# Patient Record
Sex: Female | Born: 1960 | Race: White | Hispanic: No | Marital: Married | Smoking: Former smoker
Health system: Southern US, Community
[De-identification: ages and names within clinical notes are randomized; demographics above are authoritative.]

## PROBLEM LIST (undated history)

## (undated) DIAGNOSIS — F909 Attention-deficit hyperactivity disorder, unspecified type: Secondary | ICD-10-CM

## (undated) DIAGNOSIS — M2390 Unspecified internal derangement of unspecified knee: Secondary | ICD-10-CM

## (undated) DIAGNOSIS — M2240 Chondromalacia patellae, unspecified knee: Secondary | ICD-10-CM

## (undated) DIAGNOSIS — N819 Female genital prolapse, unspecified: Secondary | ICD-10-CM

## (undated) DIAGNOSIS — E7800 Pure hypercholesterolemia, unspecified: Secondary | ICD-10-CM

## (undated) DIAGNOSIS — N951 Menopausal and female climacteric states: Secondary | ICD-10-CM

## (undated) DIAGNOSIS — F329 Major depressive disorder, single episode, unspecified: Secondary | ICD-10-CM

## (undated) DIAGNOSIS — R32 Unspecified urinary incontinence: Secondary | ICD-10-CM

## (undated) DIAGNOSIS — M1990 Unspecified osteoarthritis, unspecified site: Secondary | ICD-10-CM

## (undated) DIAGNOSIS — J189 Pneumonia, unspecified organism: Secondary | ICD-10-CM

## (undated) DIAGNOSIS — M549 Dorsalgia, unspecified: Secondary | ICD-10-CM

## (undated) DIAGNOSIS — R102 Pelvic and perineal pain: Secondary | ICD-10-CM

## (undated) DIAGNOSIS — F902 Attention-deficit hyperactivity disorder, combined type: Secondary | ICD-10-CM

## (undated) DIAGNOSIS — G8929 Other chronic pain: Secondary | ICD-10-CM

## (undated) DIAGNOSIS — J309 Allergic rhinitis, unspecified: Secondary | ICD-10-CM

## (undated) DIAGNOSIS — E079 Disorder of thyroid, unspecified: Secondary | ICD-10-CM

## (undated) DIAGNOSIS — E039 Hypothyroidism, unspecified: Secondary | ICD-10-CM

## (undated) HISTORY — DX: Pneumonia, unspecified organism: J18.9

## (undated) HISTORY — DX: Unspecified urinary incontinence: R32

## (undated) HISTORY — DX: Other chronic pain: G89.29

## (undated) HISTORY — PX: TOTAL ABDOMINAL HYSTERECTOMY: SHX209

## (undated) HISTORY — DX: Unspecified osteoarthritis, unspecified site: M19.90

## (undated) HISTORY — DX: Pelvic and perineal pain: R10.2

## (undated) HISTORY — PX: TUBAL LIGATION: SHX77

## (undated) HISTORY — DX: Chondromalacia patellae, unspecified knee: M22.40

## (undated) HISTORY — DX: Dorsalgia, unspecified: M54.9

## (undated) HISTORY — DX: Hypothyroidism, unspecified: E03.9

## (undated) HISTORY — DX: Menopausal and female climacteric states: N95.1

## (undated) HISTORY — DX: Unspecified internal derangement of unspecified knee: M23.90

## (undated) HISTORY — DX: Allergic rhinitis, unspecified: J30.9

## (undated) HISTORY — DX: Female genital prolapse, unspecified: N81.9

## (undated) HISTORY — DX: Major depressive disorder, single episode, unspecified: F32.9

## (undated) HISTORY — PX: ABDOMINAL HYSTERECTOMY: SHX81

## (undated) HISTORY — DX: Pure hypercholesterolemia, unspecified: E78.00

## (undated) HISTORY — DX: Attention-deficit hyperactivity disorder, unspecified type: F90.9

## (undated) HISTORY — DX: Attention-deficit hyperactivity disorder, combined type: F90.2

---

## 2008-07-31 ENCOUNTER — Ambulatory Visit: Payer: Self-pay

## 2012-06-28 ENCOUNTER — Ambulatory Visit: Payer: Self-pay

## 2012-06-29 ENCOUNTER — Ambulatory Visit: Payer: Self-pay

## 2012-08-10 HISTORY — PX: OTHER SURGICAL HISTORY: SHX169

## 2013-12-28 ENCOUNTER — Ambulatory Visit: Payer: Self-pay | Admitting: Obstetrics and Gynecology

## 2013-12-28 LAB — BASIC METABOLIC PANEL
ANION GAP: 4 — AB (ref 7–16)
BUN: 11 mg/dL (ref 7–18)
CO2: 27 mmol/L (ref 21–32)
CREATININE: 0.87 mg/dL (ref 0.60–1.30)
Calcium, Total: 8.6 mg/dL (ref 8.5–10.1)
Chloride: 106 mmol/L (ref 98–107)
EGFR (African American): >60
Glucose: 77 mg/dL (ref 65–99)
Osmolality: 272 (ref 275–301)
POTASSIUM: 4.4 mmol/L (ref 3.5–5.1)
Sodium: 137 mmol/L (ref 136–145)

## 2013-12-28 LAB — CBC
HCT: 36.1 % (ref 35.0–47.0)
HGB: 11.5 g/dL — ABNORMAL LOW (ref 12.0–16.0)
MCH: 24.6 pg — ABNORMAL LOW (ref 26.0–34.0)
MCHC: 31.9 g/dL — AB (ref 32.0–36.0)
MCV: 77 fL — AB (ref 80–100)
Platelet: 235 10*3/uL (ref 150–440)
RBC: 4.69 10*6/uL (ref 3.80–5.20)
RDW: 16.7 % — ABNORMAL HIGH (ref 11.5–14.5)
WBC: 5.1 10*3/uL (ref 3.6–11.0)

## 2014-01-04 ENCOUNTER — Ambulatory Visit: Payer: Self-pay | Admitting: Urology

## 2014-01-05 LAB — CBC WITH DIFFERENTIAL/PLATELET
BASOS ABS: 0 10*3/uL (ref 0.0–0.1)
Basophil %: 0.3 %
EOS ABS: 0 10*3/uL (ref 0.0–0.7)
Eosinophil %: 0.2 %
HCT: 30.3 % — AB (ref 35.0–47.0)
HGB: 9.6 g/dL — ABNORMAL LOW (ref 12.0–16.0)
LYMPHS ABS: 0.9 10*3/uL — AB (ref 1.0–3.6)
LYMPHS PCT: 10.4 %
MCH: 24.2 pg — AB (ref 26.0–34.0)
MCHC: 31.7 g/dL — AB (ref 32.0–36.0)
MCV: 76 fL — ABNORMAL LOW (ref 80–100)
MONOS PCT: 9.5 %
Monocyte #: 0.8 x10 3/mm (ref 0.2–0.9)
NEUTROS PCT: 79.6 %
Neutrophil #: 6.9 10*3/uL — ABNORMAL HIGH (ref 1.4–6.5)
Platelet: 184 10*3/uL (ref 150–440)
RBC: 3.97 10*6/uL (ref 3.80–5.20)
RDW: 16.6 % — AB (ref 11.5–14.5)
WBC: 8.6 10*3/uL (ref 3.6–11.0)

## 2014-01-05 LAB — BASIC METABOLIC PANEL
Anion Gap: 4 — ABNORMAL LOW (ref 7–16)
BUN: 8 mg/dL (ref 7–18)
CHLORIDE: 107 mmol/L (ref 98–107)
CO2: 28 mmol/L (ref 21–32)
Calcium, Total: 8.2 mg/dL — ABNORMAL LOW (ref 8.5–10.1)
Creatinine: 0.91 mg/dL (ref 0.60–1.30)
EGFR (African American): >60
EGFR (Non-African Amer.): >60
Glucose: 115 mg/dL — ABNORMAL HIGH (ref 65–99)
Osmolality: 277 (ref 275–301)
Potassium: 4.1 mmol/L (ref 3.5–5.1)
SODIUM: 139 mmol/L (ref 136–145)

## 2014-01-08 LAB — PATHOLOGY REPORT

## 2014-04-05 ENCOUNTER — Ambulatory Visit: Payer: Self-pay | Admitting: Orthopedic Surgery

## 2014-06-19 ENCOUNTER — Ambulatory Visit: Payer: Self-pay | Admitting: Nurse Practitioner

## 2014-12-01 NOTE — Op Note (Signed)
PATIENT NAME:  Jennifer Whitney, RINCK MR#:  [id number] DATE OF BIRTH:  [birth year]  DATE OF PROCEDURE:  01/04/2014  PREOPERATIVE DIAGNOSIS: Pelvic prolapse, stress urinary incontinence.  POSTOPERATIVE DIAGNOSIS: Pelvic prolapse, stress urinary incontinence plus hydrosalpinx.   PROCEDURE: Total laparoscopic hysterectomy, bilateral salpingectomy, uterosacral ligament plication, cystoscopy as well as sling placement by Dr. Elnoria Howard.   SURGEON: Delsa Sale, MD   ESTIMATED BLOOD LOSS: 50 mL.   FINDINGS: Bilateral efflux of the ureters, approximately a 14-week size uterus with bilateral hydrosalpinx and adhesions of the tubes to the ovary side wall as well as a mild cystocele and normal-appearing ovaries bilaterally.   PROCEDURE: The patient was taken to the operating room and placed in supine position. After adequate general endotracheal anesthesia was instilled, the patient was prepped and draped in the usual sterile fashion. A side-opening speculum was placed in the vagina, and the anterior lip of the cervix was grasped with a single-tooth tenaculum. The uterus was grasped, and the large V-care was placed around the cervix. The side-opening speculum and single-tooth tenaculum were removed. Attention was then turned to the umbilicus, were the umbilicus was injected with Marcaine. An incision was made, and Veress needle was placed. Hanging drop test, fluid instillation test and fluid aspiration test showed proper placement of the Veress needles. The Xcel trocar was placed under direct visualization after tympany was heard around the liver. The CO2 was placed on high flow. Then, the Xcel trocar was placed under direct visualization. The patient was placed in Trendelenburg, and 2 lower ports, an 11 mm on the left as well as a 5 mm on the right. The Harmonic scalpel was placed into the abdomen, and the tube was grasped. The Harmonic scalpel was used to separate the tube from the ovary. The round ligament was then  grasped, first on the right and then the left, and the ligament was cauterized and cut. The bladder flap was created. The uterine arteries were skeletonized, and uterine arteries were then cauterized, first on the left, then on the right. The Harmonic scalpel was then used to cut across the vaginal cuff at the level of the V-care cup. The uterus was released and passed out through the vagina. Vaginal plug was placed, and the vaginal cuff was sutured with a running Endo Stitch from 6 o'clock to 12 o'clock. The uterosacrals were approximated together. Irrigation was performed. Good hemostasis was identified. Attention was then turned to the vagina. At this time, it was found that the bladder did not need an anterior repair as when the uterosacrals were pulled up, it decreased the redundancy in the vagina. Dr. Elnoria Howard then did his procedure with the sling. Cystoscopy was performed, and efflux was seen from both ureters. The ureters had also been identified during the time of the surgery in the abdomen by the surgeons. The trocars were then removed, and UR-6 was used to approximate the fascia on the two 11 mm trocar ports, 4-0 Monocryl was used to approximate the skin edges. Foley catheter was placed. Dermabond was placed on the skin incisions. Tegaderm and 2 x 2's were placed over the incisions. Clear urine was noted in the Foley bag, and the patient was taken to recovery after having tolerated the procedure well.   ____________________________ Delsa Sale, MD cck:lb D: 01/06/2014 00:46:16 ET T: 01/06/2014 06:40:58 ET JOB#: [id number]  cc: Delsa Sale, MD, <Dictator> Delsa Sale MD ELECTRONICALLY SIGNED 01/10/2014 23:53

## 2014-12-01 NOTE — Op Note (Signed)
PATIENT NAME:  Jennifer Whitney, Jennifer Whitney MR#:  [id number] DATE OF BIRTH:  1960/08/29  DATE OF PROCEDURE:  01/04/2014  PREOPERATIVE DIAGNOSIS: Stress urinary incontinence.   POSTOPERATIVE DIAGNOSIS: Stress urinary incontinence.   PROCEDURE: MiniArc urethral sling.   ANESTHESIA: General.  With the patient sterilely prepped and draped in the supine lithotomy position for ease of approach to the external genitalia, I began the procedure. She has good relaxation from general anesthetic and has just had a laparoscopic hysterectomy. She is also going to have a possible anterior repair by the OB/GYN surgeon, Dr. Cristino Martes.  But I do a MiniArc sling utilizing posterior weighted speculum and a [place] retractor to expose the area. The urethra is exposed. Foley catheter has 30 mL in the balloon. I find the midurethra and inject with 30 mL of Pitressin and advance normal saline in a 30 to 20 ratio. Then once there is good thick area in size, I make a vertical incision with a #15 Bard Parker blade. Then with sharp Metzenbaum section, I carry the incision laterally but did not penetrate the endopelvic fascia. And I penetrate the fascia and muscle on the left and right utilizing a MiniArc sling released the barb anchor> on the right side and then I make sure it is tight on the left utilizing deeper penetration with the MiniArc introducer. It is a nice snug fit against the urethra. The urethra is intact and has not been penetrated. So then I close the incision horizontally utilizing running 3-0 Vicryl suture and double layer closure. She tolerates the procedure well and will have further repair done by Dr. Cristino Martes.   ____________________________ Janice Coffin. Elnoria Howard, [place] rdh:ce D: 01/04/2014 14:44:25 ET T: 01/04/2014 15:14:35 ET JOB#: [id number]  cc: Janice Coffin. Elnoria Howard, DO, <Dictator> RICHARD D HART DO ELECTRONICALLY SIGNED 01/05/2014 13:12

## 2015-06-17 DIAGNOSIS — E7800 Pure hypercholesterolemia, unspecified: Secondary | ICD-10-CM

## 2015-06-17 HISTORY — DX: Pure hypercholesterolemia, unspecified: E78.00

## 2015-11-10 ENCOUNTER — Ambulatory Visit (INDEPENDENT_AMBULATORY_CARE_PROVIDER_SITE_OTHER): Payer: Worker's Compensation

## 2015-11-10 ENCOUNTER — Encounter: Payer: Self-pay | Admitting: Emergency Medicine

## 2015-11-10 ENCOUNTER — Ambulatory Visit
Admission: EM | Admit: 2015-11-10 | Discharge: 2015-11-10 | Disposition: A | Discharge status: Home / Self Care | Payer: Worker's Compensation | Attending: Family Medicine | Admitting: Family Medicine

## 2015-11-10 DIAGNOSIS — S93401A Sprain of unspecified ligament of right ankle, initial encounter: Secondary | ICD-10-CM

## 2015-11-10 HISTORY — DX: Disorder of thyroid, unspecified: E07.9

## 2015-11-10 NOTE — ED Provider Notes (Signed)
CSN: [id number]     Arrival date & time 11/10/15  1419 History   First MD Initiated Contact with Patient 11/10/15 1554    Nurses notes were reviewed. Chief Complaint  Patient presents with  . Ankle Pain    gym class. 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  She overextended the right foot causing severe pain in the medial aspect of the ankle the mid posterior back of ankle and the instep of the right ankle. She takes Mobic for arthritic knee and states that may have helped it but she continued to have difficulty so she came in.   she does not smoke. Thyroid disease. She's has a history of abdominal hysterectomy for the past. She denies any significant family medical history pertinent to this visit.    (Consider location/radiation/quality/duration/timing/severity/associated sxs/prior Treatment) Patient is a 55 y.o. female presenting with ankle pain. The history is provided by the patient.  Ankle Pain Location:  Ankle Time since incident:  3 days Injury: yes   Mechanism of injury comment:  Twisted Ankle with over flexion motion while trying to stop on a wasp Ankle location:  R ankle Pain details:    Quality:  Aching and burning   Radiates to:  Does not radiate   Onset quality:  Sudden   Duration:  3 days   Progression:  Worsening Chronicity:  New Dislocation: no   Foreign body present:  No foreign bodies Prior injury to area:  No Relieved by:  NSAIDs Worsened by:  Bearing weight and rotation Associated symptoms: decreased ROM   Associated symptoms: no fever, no itching and no muscle weakness   Risk factors: no concern for non-accidental trauma, no frequent fractures, no known bone disorder, no obesity and no recent illness     Past Medical History  Diagnosis Date  . Thyroid disease    Past Surgical History  Procedure Laterality Date  . Abdominal hysterectomy    . Tubal ligation     History reviewed. No  pertinent family history. Social History  Substance Use Topics  . Smoking status: Never Smoker   . Smokeless tobacco: None  . Alcohol Use: No   OB History    No data available     Review of Systems  Constitutional: Negative for fever.  Skin: Negative for itching.    Allergies  Review of patient's allergies indicates no known allergies.  Home Medications   Prior to Admission medications   Medication Sig Start Date End Date Taking? Authorizing Provider  citalopram (CELEXA) 20 MG tablet Take 20 mg by mouth daily.   Yes Historical Provider, MD  levothyroxine (SYNTHROID, LEVOTHROID) 25 MCG tablet Take 25 mcg by mouth daily before breakfast.   Yes Historical Provider, MD  meloxicam (MOBIC) 15 MG tablet Take 15 mg by mouth daily.   Yes Historical Provider, MD   Meds Ordered and Administered this Visit  Medications - No data to display  BP 124/73 mmHg  Pulse 66  Temp(Src) 98.2 F (36.8 C) (Tympanic)  Resp 16  Ht 5\' 7"  (1.702 m)  Wt 192 lb (87.091 kg)  BMI 30.06 kg/m2  SpO2 98% No data found.   Physical Exam  Constitutional: She is oriented to person, place, and time. She appears well-developed and well-nourished.  HENT:  Head: Normocephalic and atraumatic.  Eyes: Conjunctivae are normal. Pupils are equal, round, and reactive to light.  Neck: Neck supple.  Musculoskeletal: She exhibits edema and tenderness.       Right ankle: She exhibits swelling. Tenderness. Medial malleolus tenderness found.       Feet:   There is tenderness over the insect step of the right foot and ankle there is tenderness in the posterior tendon but not over the Achilles heel. She has some tenderness over the medial malleolus and swelling over the medial malleolus  Neurological: She is alert and oriented to person, place, and time.  Skin: Skin is warm and dry.  Psychiatric: She has a normal mood and affect.  Vitals reviewed.   ED Course  Procedures (including critical care time)  Labs  Review Labs Reviewed - No data to display  Imaging Review Dg Ankle Complete Right  11/10/2015  CLINICAL DATA:  Posterior RIGHT ankle joint pain for 2 days after stopping down hard with foot on a gym floor, felt a sharp pain with pins and needles feeling after impact, no prior injury, initial encounter EXAM: RIGHT ANKLE - COMPLETE 3+ VIEW COMPARISON:  None FINDINGS: Osseous mineralization normal. Joint spaces preserved. Tiny plantar calcaneal spur. No acute fracture, dislocation or bone destruction. IMPRESSION: No acute osseous abnormalities. Electronically Signed   By: Lavonia Dana M.D.   On: 11/10/2015 15:57     Visual Acuity Review  Right Eye Distance:   Left Eye Distance:   Bilateral Distance:    Right Eye Near:   Left Eye Near:    Bilateral Near:         MDM   1. Right ankle sprain, initial encounter     work when she became the Mobic 15 mg. Use Tylenol if she needs appearance of pain she declined any type of narcotic. We'll place her ankle stirrups and have her return for follow-up as needed or see her PCP in about a week. She states that she does not.    Frederich Cha, MD 11/10/15 [phone]

## 2015-11-10 NOTE — ED Notes (Signed)
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  Patient c/o pain that goes around her right ankle.

## 2015-11-10 NOTE — Discharge Instructions (Signed)
Ankle Sprain °An ankle sprain is an injury to the strong, fibrous tissues (ligaments) that hold your ankle bones together.  °HOME CARE  °· Put ice on your ankle for 1-2 days or as told by your doctor. °· Put ice in a plastic bag. °· Place a towel between your skin and the bag. °· Leave the ice on for 15-20 minutes at a time, every 2 hours while you are awake. °· Only take medicine as told by your doctor. °· Raise (elevate) your injured ankle above the level of your heart as much as possible for 2-3 days. °· Use crutches if your doctor tells you to. Slowly put your own weight on the affected ankle. Use the crutches until you can walk without pain. °· If you have a plaster splint: °· Do not rest it on anything harder than a pillow for 24 hours. °· Do not put weight on it. °· Do not get it wet. °· Take it off to shower or bathe. °· If given, use an elastic wrap or support stocking for support. Take the wrap off if your toes lose feeling (numb), tingle, or turn cold or blue. °· If you have an air splint: °· Add or let out air to make it comfortable. °· Take it off at night and to shower and bathe. °· Wiggle your toes and move your ankle up and down often while you are wearing it. °GET HELP IF: °· You have rapidly increasing bruising or puffiness (swelling). °· Your toes feel very cold. °· You lose feeling in your foot. °· Your medicine does not help your pain. °GET HELP RIGHT AWAY IF:  °· Your toes lose feeling (numb) or turn blue. °· You have severe pain that is increasing. °MAKE SURE YOU:  °· Understand these instructions. °· Will watch your condition. °· Will get help right away if you are not doing well or get worse. °  °This information is not intended to replace advice given to you by your health care provider. Make sure you discuss any questions you have with your health care provider. °  °Document Released: 01/13/2008 Document Revised: 08/17/2014 Document Reviewed: 02/08/2012 °Elsevier Interactive Patient  Education ©2016 Elsevier Inc. ° °Cryotherapy °Cryotherapy is when you put ice on your injury. Ice helps lessen pain and puffiness (swelling) after an injury. Ice works the best when you start using it in the first 24 to 48 hours after an injury. °HOME CARE °· Put a dry or damp towel between the ice pack and your skin. °· You may press gently on the ice pack. °· Leave the ice on for no more than 10 to 20 minutes at a time. °· Check your skin after 5 minutes to make sure your skin is okay. °· Rest at least 20 minutes between ice pack uses. °· Stop using ice when your skin loses feeling (numbness). °· Do not use ice on someone who cannot tell you when it hurts. This includes small children and people with memory problems (dementia). °GET HELP RIGHT AWAY IF: °· You have white spots on your skin. °· Your skin turns blue or pale. °· Your skin feels waxy or hard. °· Your puffiness gets worse. °MAKE SURE YOU:  °· Understand these instructions. °· Will watch your condition. °· Will get help right away if you are not doing well or get worse. °  °This information is not intended to replace advice given to you by your health care provider. Make sure you discuss any   questions you have with your health care provider.   Document Released: 01/13/2008 Document Revised: 10/19/2011 Document Reviewed: 03/19/2011 Elsevier Interactive Patient Education 2016 Elsevier Inc.  Stirrup Ankle Brace Stirrup ankle braces give support and help stabilize the ankle joint. They are rigid pieces of plastic or fiberglass that go up both sides of the lower leg with the bottom of the stirrup fitting comfortably under the bottom of the instep of the foot. It can be held on with Velcro straps or an elastic wrap. Stirrup ankle braces are used to support the ankle following mild or moderate sprains or strains, or fractures after cast removal.  They can be easily removed or adjusted if there is swelling. The rigid brace shells are designed to fit the  ankle comfortably and provide the needed medial/lateral stabilization. This brace can be easily worn with most athletic shoes. The brace liner is usually made of a soft, comfortable gel-like material. This gel fits the ankle well without causing uncomfortable pressure points.  IMPORTANCE OF ANKLE BRACES:  The use of ankle bracing is effective in the prevention of ankle sprains.  In athletes, the use of ankle bracing will offer protection and prevent further sprains.  Research shows that a complete rehabilitation program needs to be included with external bracing. This includes range of motion and ankle strengthening exercises. Your caregivers will instruct you in this. If you were given the brace today for a new injury, use the following home care instructions as a guide. HOME CARE INSTRUCTIONS   Apply ice to the sore area for 15-20 minutes, 03-04 times per day while awake for the first 2 days. Put the ice in a plastic bag and place a towel between the bag of ice and your skin. Never place the ice pack directly on your skin. Be especially careful using ice on an elbow or knee or other bony area, such as your ankle, because icing for too long may damage the nerves which are close to the surface.  Keep your leg elevated when possible to lessen swelling.  Wear your splint until you are seen for a follow-up examination. Do not put weight on it. Do not get it wet. You may take it off to take a shower or bath.  For Activity: Use crutches with non-weight bearing for 1 week. Then, you may walk on your ankle as instructed. Start gradually with weight bearing on the affected ankle.  Continue to use crutches or a cane until you can stand on your ankle without causing pain.  Wiggle your toes in the splint several times per day if you are able.  The splint is too tight if you have numbness, tingling, or if your foot becomes cold and blue. Adjust the straps or elastic bandage to make it comfortable.  Only  take over-the-counter or prescription medicines for pain, discomfort, or fever as directed by your caregiver. SEEK IMMEDIATE MEDICAL CARE IF:   You have increased bruising, swelling or pain.  Your toes are blue or cold and loosening the brace or wrap does not help.  Your pain is not relieved with medicine. MAKE SURE YOU:   Understand these instructions.  Will watch your condition.  Will get help right away if you are not doing well or get worse.   This information is not intended to replace advice given to you by your health care provider. Make sure you discuss any questions you have with your health care provider.   Document Released: 05/27/2004 Document Revised: 10/19/2011  Document Reviewed: 02/26/2015 Elsevier Interactive Patient Education [employer].

## 2015-11-25 DIAGNOSIS — F329 Major depressive disorder, single episode, unspecified: Secondary | ICD-10-CM | POA: Insufficient documentation

## 2015-11-25 DIAGNOSIS — F32A Depression, unspecified: Secondary | ICD-10-CM

## 2015-11-25 DIAGNOSIS — E039 Hypothyroidism, unspecified: Secondary | ICD-10-CM

## 2015-11-25 HISTORY — DX: Depression, unspecified: F32.A

## 2015-11-25 HISTORY — DX: Hypothyroidism, unspecified: E03.9

## 2016-05-09 ENCOUNTER — Emergency Department: Payer: BLUE CROSS/BLUE SHIELD

## 2016-05-09 ENCOUNTER — Emergency Department
Admission: EM | Admit: 2016-05-09 | Discharge: 2016-05-09 | Disposition: A | Discharge status: Home / Self Care | Payer: BLUE CROSS/BLUE SHIELD | Attending: Emergency Medicine | Admitting: Emergency Medicine

## 2016-05-09 DIAGNOSIS — Y929 Unspecified place or not applicable: Secondary | ICD-10-CM | POA: Insufficient documentation

## 2016-05-09 DIAGNOSIS — W11XXXA Fall on and from ladder, initial encounter: Secondary | ICD-10-CM | POA: Diagnosis not present

## 2016-05-09 DIAGNOSIS — S6992XA Unspecified injury of left wrist, hand and finger(s), initial encounter: Secondary | ICD-10-CM | POA: Diagnosis present

## 2016-05-09 DIAGNOSIS — Y939 Activity, unspecified: Secondary | ICD-10-CM | POA: Diagnosis not present

## 2016-05-09 DIAGNOSIS — S52502A Unspecified fracture of the lower end of left radius, initial encounter for closed fracture: Secondary | ICD-10-CM

## 2016-05-09 DIAGNOSIS — S52572A Other intraarticular fracture of lower end of left radius, initial encounter for closed fracture: Secondary | ICD-10-CM | POA: Insufficient documentation

## 2016-05-09 DIAGNOSIS — E039 Hypothyroidism, unspecified: Secondary | ICD-10-CM | POA: Diagnosis not present

## 2016-05-09 DIAGNOSIS — Y999 Unspecified external cause status: Secondary | ICD-10-CM | POA: Diagnosis not present

## 2016-05-09 MED ORDER — OXYCODONE-ACETAMINOPHEN 7.5-325 MG PO TABS
1.0000 | ORAL_TABLET | ORAL | 0 refills | Status: AC | PRN
Start: 2016-05-09 — End: 2017-05-09

## 2016-05-09 MED ORDER — IBUPROFEN 600 MG PO TABS
600.0000 mg | ORAL_TABLET | Freq: Once | ORAL | Status: AC
  Administered 2016-05-09: 600 mg via ORAL
  Filled 2016-05-09: qty 1

## 2016-05-09 MED ORDER — OXYCODONE-ACETAMINOPHEN 5-325 MG PO TABS
1.0000 | ORAL_TABLET | Freq: Once | ORAL | Status: AC
  Administered 2016-05-09: 1 via ORAL

## 2016-05-09 MED ORDER — OXYCODONE-ACETAMINOPHEN 5-325 MG PO TABS
ORAL_TABLET | ORAL | Status: AC
  Filled 2016-05-09: qty 1

## 2016-05-09 NOTE — ED Provider Notes (Signed)
[facility] Emergency Department Provider Note   ____________________________________________   None    (approximate)  I have reviewed the triage vital signs and the nursing notes.   HISTORY  Chief Complaint Fall and Wrist Pain    HPI Jennifer Whitney is a 55 y.o. female patient complaining of pain to left wrist and forearm secondary to a fall from a ladder. Patient states she developed problems with 4 feet break and fall with the left wrist. Patient also complaining of abrasion to the right forearm and left knee.She rates the pain as 8/10. Patient describes the pain is "achy". No palliative measures prior to arrival. Patient given ice pack a triage. Patient is right-hand dominant.  Past Medical History:  Diagnosis Date  . Thyroid disease     There are no active problems to display for this patient.   Past Surgical History:  Procedure Laterality Date  . ABDOMINAL HYSTERECTOMY    . TUBAL LIGATION      Prior to Admission medications   Medication Sig Start Date End Date Taking? Authorizing Provider  citalopram (CELEXA) 20 MG tablet Take 20 mg by mouth daily.    Historical Provider, MD  levothyroxine (SYNTHROID, LEVOTHROID) 25 MCG tablet Take 25 mcg by mouth daily before breakfast.    Historical Provider, MD  meloxicam (MOBIC) 15 MG tablet Take 15 mg by mouth daily.    Historical Provider, MD  oxyCODONE-acetaminophen (PERCOCET) 7.5-325 MG tablet Take 1 tablet by mouth every 4 (four) hours as needed for severe pain. 05/09/16 05/09/17  Sable Feil, PA-C    Allergies Review of patient's allergies indicates no known allergies.  No family history on file.  Social History Social History  Substance Use Topics  . Smoking status: Never Smoker  . Smokeless tobacco: Not on file  . Alcohol use No    Review of Systems Constitutional: No fever/chills Eyes: No visual changes. ENT: No sore throat. Cardiovascular: Denies chest pain. Respiratory: Denies  shortness of breath. Gastrointestinal: No abdominal pain.  No nausea, no vomiting.  No diarrhea.  No constipation. Genitourinary: Negative for dysuria. Musculoskeletal: Left wrist forearm pain. Skin: Negative for rash. Abrasion to right forearm and left ankle. Neurological: Negative for headaches, focal weakness or numbness. Endocrine:Hypothyroidism. ____________________________________________   PHYSICAL EXAM:  VITAL SIGNS: ED Triage Vitals  Enc Vitals Group     BP 05/09/16 2003 (!) 167/85     Pulse Rate 05/09/16 2003 (!) 57     Resp 05/09/16 2003 18     Temp 05/09/16 2003 97.9 F (36.6 C)     Temp Source 05/09/16 2003 Oral     SpO2 05/09/16 2003 98 %     Weight 05/09/16 2004 190 lb (86.2 kg)     Height 05/09/16 2004 5\' 7"  (1.702 m)     Head Circumference --      Peak Flow --      Pain Score 05/09/16 2004 8     Pain Loc --      Pain Edu? --      Excl. in Comfort? --     Constitutional: Alert and oriented. Well appearing and in no acute distress. Eyes: Conjunctivae are normal. PERRL. EOMI. Head: Atraumatic. Nose: No congestion/rhinnorhea. Mouth/Throat: Mucous membranes are moist.  Oropharynx non-erythematous. Neck: No stridor.  No cervical spine tenderness to palpation. Hematological/Lymphatic/Immunilogical: No cervical lymphadenopathy. Cardiovascular: Normal rate, regular rhythm. Grossly normal heart sounds.  Good peripheral circulation.Elevated blood pressure Respiratory: Normal respiratory effort.  No retractions. Lungs  CTAB. Gastrointestinal: Soft and nontender. No distention. No abdominal bruits. No CVA tenderness. Musculoskeletal: No obvious deformity to the left wrist. Patient has some moderate guarding palpation of the distal radius. There is mild edema.  Neurologic:  Normal speech and language. No gross focal neurologic deficits are appreciated. No gait instability. Skin:  Skin is warm, dry and intact. No rash noted. Abrasion to right forearm and left medial  ankle. Psychiatric: Mood and affect are normal. Speech and behavior are normal.  ____________________________________________   LABS (all labs ordered are listed, but only abnormal results are displayed)  Labs Reviewed - No data to display ____________________________________________  EKG   ____________________________________________  RADIOLOGY x-rays revealed oblique fracture through the distal radius. ____________________________________________   PROCEDURES  Procedure(s) performed: None  Procedures  Critical Care performed: No  ____________________________________________   INITIAL IMPRESSION / ASSESSMENT AND PLAN / ED COURSE  Pertinent labs & imaging results that were available during my care of the patient were reviewed by me and considered in my medical decision making (see chart for details). Left distal radial fracture. Discussed x-ray finding with patient. Patient given discharge care instructions. Patient will [facility]oped[facility]. Patient given a prescription for Percocets.  Clinical Course     ____________________________________________   FINAL CLINICAL IMPRESSION(S) / ED DIAGNOSES  Final diagnoses:  Distal radius fracture, left, closed, initial encounter      NEW MEDICATIONS STARTED DURING THIS VISIT:  New Prescriptions   OXYCODONE-ACETAMINOPHEN (PERCOCET) 7.5-325 MG TABLET    Take 1 tablet by mouth every 4 (four) hours as needed for severe pain.     Note:  This document was prepared using Dragon voice recognition software and may include unintentional dictation errors.    Sable Feil, PA-C 05/09/16 2127    Daymon Larsen, MD 05/09/16 2159

## 2016-05-09 NOTE — ED Notes (Signed)
Splint aftercare instructions provided to pt and spouse who verbalize understanding. Instructed pt to return if five "p"s occur and discussed good capillary refill instructions with pt.

## 2016-05-09 NOTE — ED Notes (Signed)
Splint applied by sarah blake, ed tech. Splint application reviewed by ron smith, pa.

## 2016-05-09 NOTE — ED Triage Notes (Signed)
Pt ambulatory to triage with no difficulty. Pt reports she fell approx 4 feet off a ladder landing on her left wrist. Pt reports pain to her left wrist and arm.  CMS intact . Pt also reports she scraped her right ankle during the fall and has an abrasion and bruise to the area.

## 2016-05-09 NOTE — ED Notes (Signed)
Pt states fell off 4 foot ladder landing on left side. Pt with abrasion with no bleeding to left lateral elbow, pain and swelling to left wrist. Pt also complains of left ankle pain, but cms is intact to all extremities with exception of being able to flex left wrist. Pt has ice in place to left wrist. Pt denies loc.

## 2017-06-14 DIAGNOSIS — R32 Unspecified urinary incontinence: Secondary | ICD-10-CM

## 2017-06-14 DIAGNOSIS — J309 Allergic rhinitis, unspecified: Secondary | ICD-10-CM

## 2017-06-14 DIAGNOSIS — F325 Major depressive disorder, single episode, in full remission: Secondary | ICD-10-CM | POA: Insufficient documentation

## 2017-06-14 DIAGNOSIS — N951 Menopausal and female climacteric states: Secondary | ICD-10-CM | POA: Insufficient documentation

## 2017-06-14 HISTORY — DX: Menopausal and female climacteric states: N95.1

## 2017-06-14 HISTORY — DX: Unspecified urinary incontinence: R32

## 2017-06-14 HISTORY — DX: Allergic rhinitis, unspecified: J30.9

## 2017-06-28 ENCOUNTER — Encounter: Payer: Self-pay | Admitting: Urology

## 2017-06-28 ENCOUNTER — Ambulatory Visit: Payer: BLUE CROSS/BLUE SHIELD | Admitting: Urology

## 2017-06-28 VITALS — BP 116/64 | HR 84 | Ht 68.0 in | Wt 201.0 lb

## 2017-06-28 DIAGNOSIS — N819 Female genital prolapse, unspecified: Secondary | ICD-10-CM

## 2017-06-28 DIAGNOSIS — R102 Pelvic and perineal pain unspecified side: Secondary | ICD-10-CM | POA: Insufficient documentation

## 2017-06-28 DIAGNOSIS — N368 Other specified disorders of urethra: Secondary | ICD-10-CM | POA: Diagnosis not present

## 2017-06-28 DIAGNOSIS — R32 Unspecified urinary incontinence: Secondary | ICD-10-CM | POA: Diagnosis not present

## 2017-06-28 DIAGNOSIS — M549 Dorsalgia, unspecified: Secondary | ICD-10-CM

## 2017-06-28 DIAGNOSIS — M2240 Chondromalacia patellae, unspecified knee: Secondary | ICD-10-CM | POA: Insufficient documentation

## 2017-06-28 DIAGNOSIS — G8929 Other chronic pain: Secondary | ICD-10-CM

## 2017-06-28 DIAGNOSIS — M2390 Unspecified internal derangement of unspecified knee: Secondary | ICD-10-CM | POA: Insufficient documentation

## 2017-06-28 DIAGNOSIS — T7840XA Allergy, unspecified, initial encounter: Secondary | ICD-10-CM | POA: Insufficient documentation

## 2017-06-28 HISTORY — DX: Other chronic pain: G89.29

## 2017-06-28 HISTORY — DX: Chondromalacia patellae, unspecified knee: M22.40

## 2017-06-28 HISTORY — DX: Female genital prolapse, unspecified: N81.9

## 2017-06-28 HISTORY — DX: Unspecified internal derangement of unspecified knee: M23.90

## 2017-06-28 HISTORY — DX: Pelvic and perineal pain: R10.2

## 2017-06-28 LAB — MICROSCOPIC EXAMINATION

## 2017-06-28 LAB — URINALYSIS, COMPLETE
Bilirubin, UA: NEGATIVE
Glucose, UA: NEGATIVE
Nitrite, UA: NEGATIVE
PH UA: 5.5 (ref 5.0–7.5)
Specific Gravity, UA: 1.025 (ref 1.005–1.030)
Urobilinogen, Ur: 0.2 mg/dL (ref 0.2–1.0)

## 2017-06-28 LAB — BLADDER SCAN AMB NON-IMAGING

## 2017-06-28 NOTE — Progress Notes (Signed)
06/28/2017 4:34 PM   Yancey Flemings [birth year] [id number]  Referring provider: Glendon Axe, MD [facility] [place], [place] [address]  Chief Complaint  Patient presents with  . Urinary Incontinence    New patient    HPI: Jennifer Whitney is a 56 yo female who presents for evaluation of urethral spotting and lower urinary tract symptoms.  She underwent a pubovaginal sling with MiniArc in May 2015 in conjunction with laparoscopic hysterectomy, BSO and ureterosacral ligament plication by gynecology.  Stress incontinence resolved however she had recurrent prolapse.  She is currently being seen by Dr. Salome Holmes with [facility].  Surgical treatment options were discussed and she is currently being managed with a pessary.  She feels she empties much better with her pessary in place.  She notes occasional blood on toilet tissue when wiping after urination and in her pad.  She has not had cystoscopy.  She denies gross hematuria.   PMH: Past Medical History:  Diagnosis Date  . Acquired hypothyroidism 11/25/2015  . Allergic rhinitis 06/14/2017  . 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 Chronic back pain 06/28/2017  . Depression 11/25/2015  . 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 High cholesterol 06/17/2015   Overview:  The 10-year ASCVD risk score Mikey Bussing DC Jr., et al., 2013) is: 2.4%-2016  . Menopausal syndrome (hot flashes) 06/14/2017  . Pelvic pressure in female 06/28/2017  . Thyroid disease   . Urinary incontinence in female 06/14/2017  . 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 ABDOMINAL HYSTERECTOMY    . Bladder Mesh  2014  . TUBAL LIGATION      Home Medications:  Allergies as of 06/28/2017   No Known Allergies     Medication List        Accurate as of 06/28/17  4:34 PM. Always use your most recent med list.          citalopram 20 MG tablet Commonly known as:  CELEXA Take 20 mg  by mouth daily.   estradiol 0.5 MG tablet Commonly known as:  ESTRACE Take by mouth.   levothyroxine 25 MCG tablet Commonly known as:  SYNTHROID, LEVOTHROID Take 25 mcg by mouth daily before breakfast.   meloxicam 15 MG tablet Commonly known as:  MOBIC Take 15 mg by mouth daily.       Allergies: No Known Allergies  Family History: Family History  Problem Relation Age of Onset  . Prostate cancer Neg Hx   . Kidney cancer Neg Hx     Social History:  reports that  has never smoked. she has never used smokeless tobacco. She reports that she does not drink alcohol or use drugs.  ROS: Otherwise noncontributory except as per the HPI.  Physical Exam: BP 116/64   Pulse 84   Ht 5\' 8"  (1.727 m)   Wt 201 lb (91.2 kg)   BMI 30.56 kg/m   Constitutional:  Alert and oriented, No acute distress. HEENT: [place] AT, moist mucus membranes.  Trachea midline, no masses. Cardiovascular: No clubbing, cyanosis, or edema. Respiratory: Normal respiratory effort, no increased work of breathing. GI: Abdomen is soft, nontender, nondistended, no abdominal masses GU: No CVA tenderness. Skin: No rashes, bruises or suspicious lesions. Lymph: No cervical or inguinal adenopathy. Neurologic: Grossly intact, no focal deficits, moving all 4 extremities. Psychiatric: Normal mood and affect.  Laboratory Data: Lab Results  Component Value Date   WBC 8.6 01/05/2014  HGB 9.6 (L) 01/05/2014   HCT 30.3 (L) 01/05/2014   MCV 76 (L) 01/05/2014   PLT 184 01/05/2014    Lab Results  Component Value Date   CREATININE 0.91 01/05/2014    Urinalysis Lab Results  Component Value Date   SPECGRAV 1.025 06/28/2017   PHUR 5.5 06/28/2017   COLORU Yellow 06/28/2017   APPEARANCEUR Cloudy (A) 06/28/2017   LEUKOCYTESUR 1+ (A) 06/28/2017   PROTEINUR 2+ (A) 06/28/2017   GLUCOSEU Negative 06/28/2017   KETONESU Trace (A) 06/28/2017   RBCU 2+ (A) 06/28/2017   BILIRUBINUR Negative 06/28/2017   UUROB 0.2 06/28/2017    NITRITE Negative 06/28/2017    Lab Results  Component Value Date   LABMICR See below: 06/28/2017   WBCUA 6-10 (A) 06/28/2017   RBCUA 11-30 (A) 06/28/2017   LABEPIT 0-10 06/28/2017   MUCUS Present (A) 06/28/2017   BACTERIA Few (A) 06/28/2017     Assessment & Plan:    1.  Vaginal spotting May be secondary to her pessary however with her history of pubovaginal sling and recommended cystoscopy.  PVR by bladder scan was 0 mL.  Urinalysis did show pyuria and microhematuria.  Urine culture was ordered.  - Urinalysis, Complete - BLADDER SCAN AMB NON-IMAGING - CULTURE, URINE COMPREHENSIVE   Abbie Sons, MD  [facility] [address] [place], [place] [address] [phone]

## 2017-07-02 LAB — CULTURE, URINE COMPREHENSIVE

## 2017-07-05 ENCOUNTER — Telehealth: Payer: Self-pay

## 2017-07-05 NOTE — Telephone Encounter (Signed)
Will send a letter

## 2017-07-05 NOTE — Telephone Encounter (Signed)
-----   Message from Abbie Sons, MD sent at 07/03/2017  9:01 PM EST ----- Urine cx was negative for infection

## 2017-07-08 ENCOUNTER — Other Ambulatory Visit: Payer: BLUE CROSS/BLUE SHIELD | Admitting: Urology

## 2017-10-11 IMAGING — DX DG WRIST COMPLETE 3+V*L*
4 series · 4 of 4 positions shown · non-contrast
Comparison: None.

CLINICAL DATA: Patient states she fell approx 4 feet off a ladder
landing on her left wrist. Slight visible deformity. She is having
pain to the wrist with movement. No previous injuries to site

EXAM:
LEFT WRIST - COMPLETE 3+ VIEW

[wrist obl]
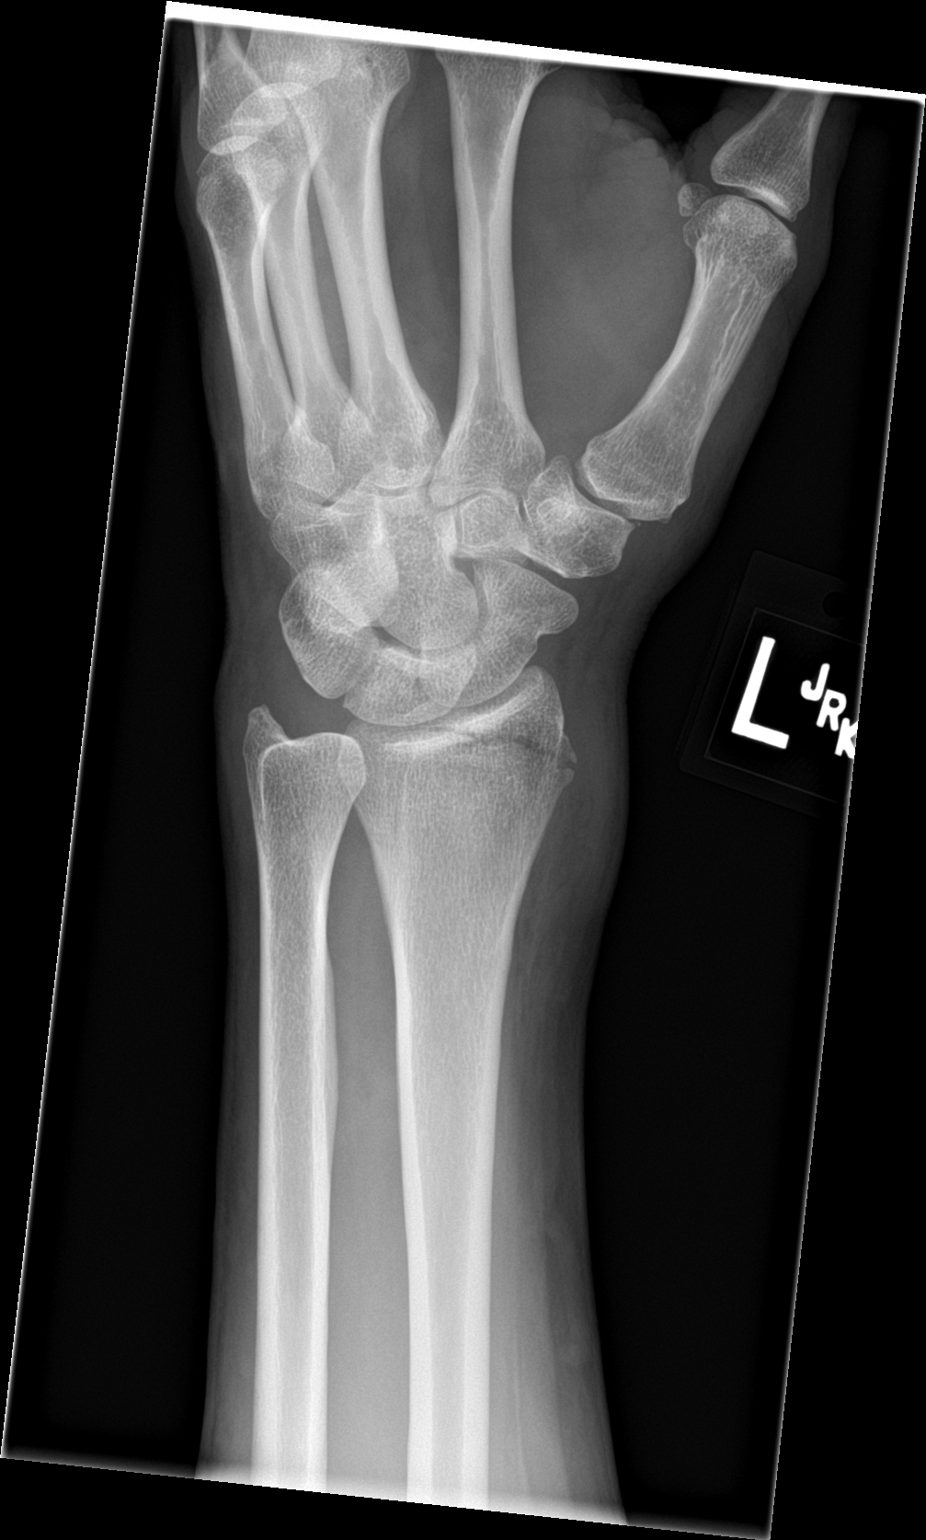

[wrist lat]
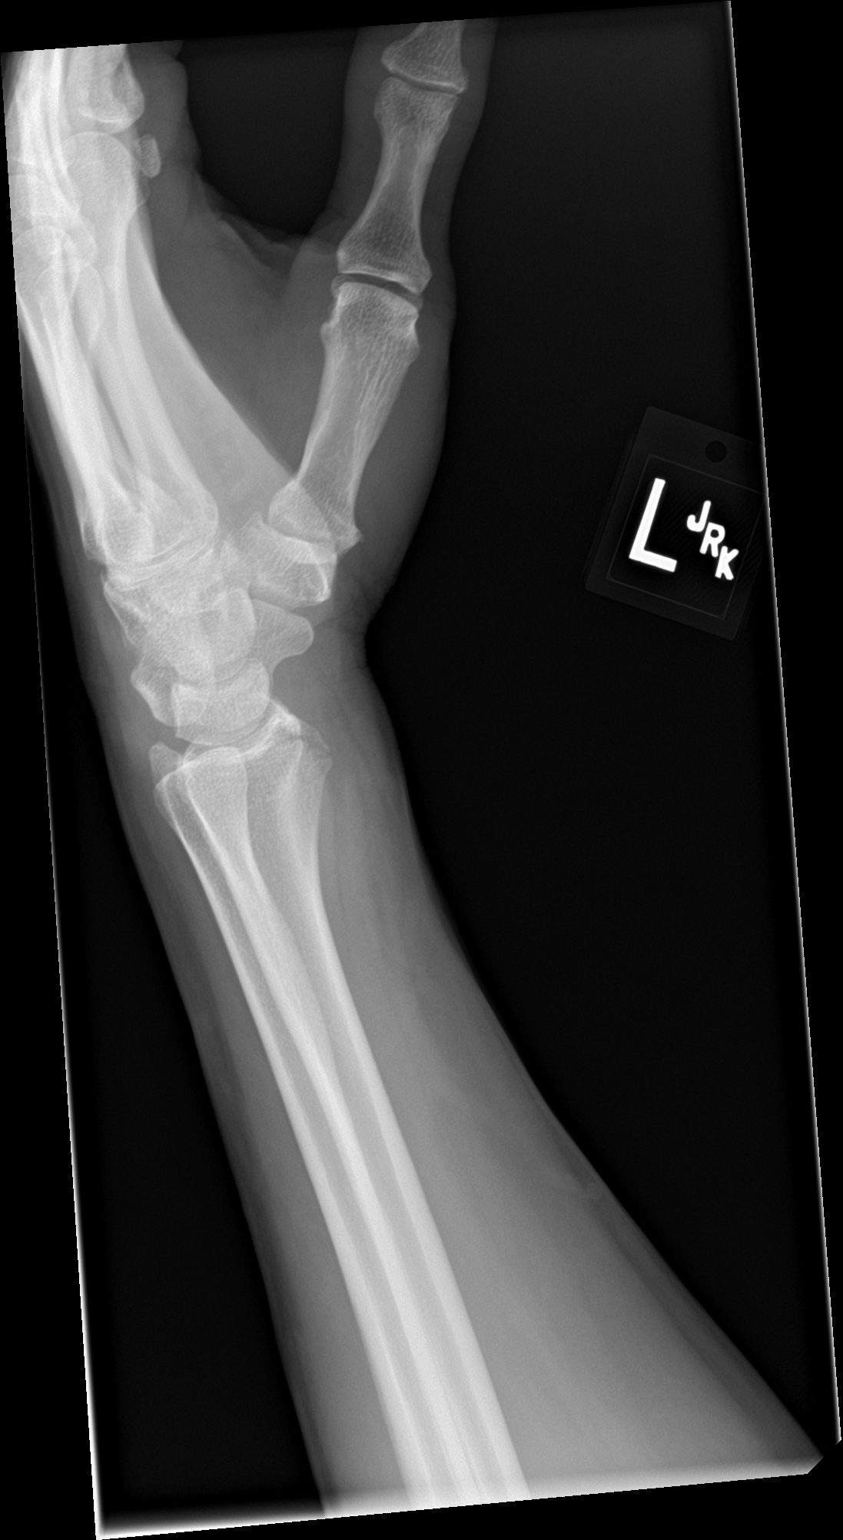

[wrist ap (1 of 2)]
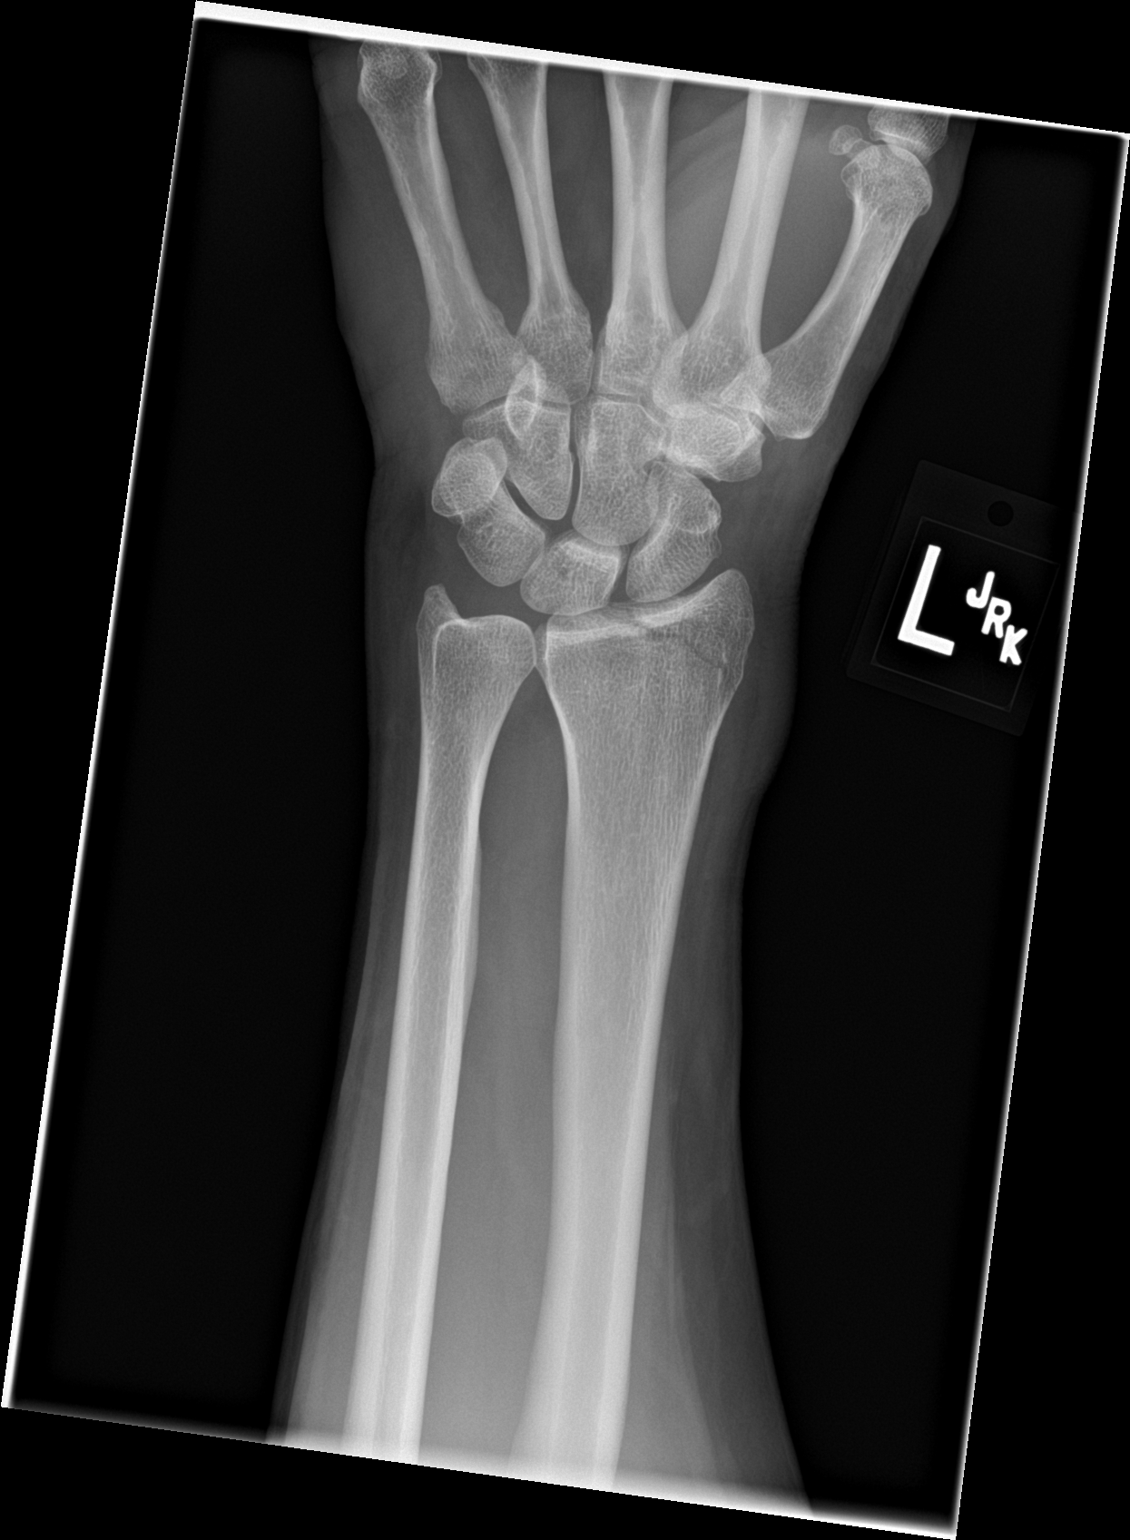

[wrist ap (2 of 2)]
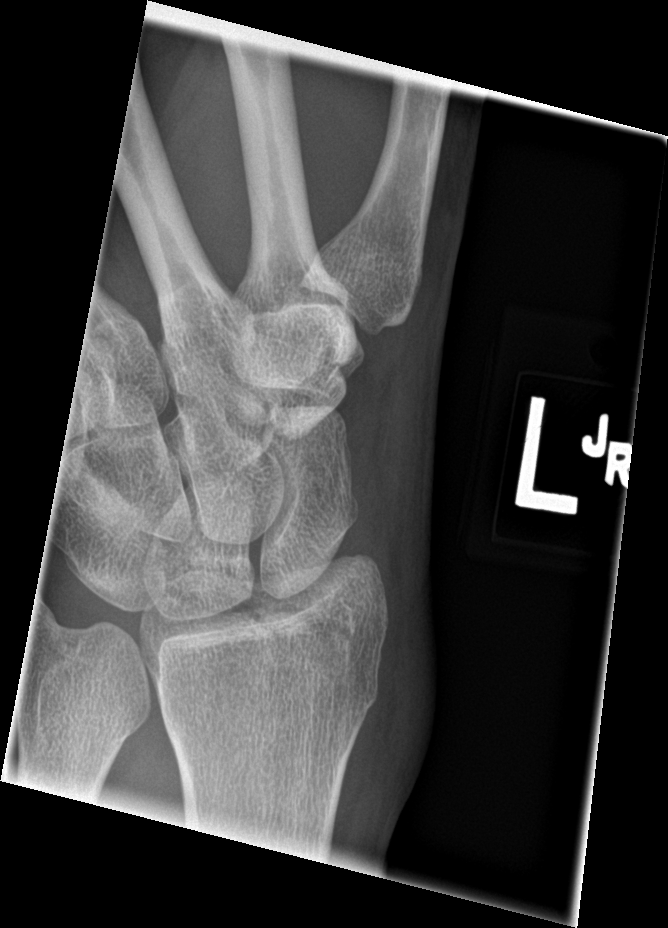

[4 of 4 positions shown; findings below may reference images not displayed]

FINDINGS: Oblique fracture through the distal radius. Fracture plane enters
articular surface. Radiocarpal joint is intact. Minimal
displacement.
IMPRESSION: Minimally displaced intra-articular fracture of the distal radius.

## 2018-01-31 ENCOUNTER — Encounter: Payer: Self-pay | Admitting: Family Medicine

## 2018-01-31 ENCOUNTER — Ambulatory Visit (INDEPENDENT_AMBULATORY_CARE_PROVIDER_SITE_OTHER): Payer: BLUE CROSS/BLUE SHIELD | Admitting: Family Medicine

## 2018-01-31 VITALS — BP 126/70 | HR 90 | Temp 98.1°F | Resp 16 | Ht 67.0 in | Wt 212.4 lb

## 2018-01-31 DIAGNOSIS — E039 Hypothyroidism, unspecified: Secondary | ICD-10-CM | POA: Diagnosis not present

## 2018-01-31 DIAGNOSIS — F32A Depression, unspecified: Secondary | ICD-10-CM

## 2018-01-31 DIAGNOSIS — E7800 Pure hypercholesterolemia, unspecified: Secondary | ICD-10-CM

## 2018-01-31 DIAGNOSIS — Z1231 Encounter for screening mammogram for malignant neoplasm of breast: Secondary | ICD-10-CM

## 2018-01-31 DIAGNOSIS — Z1211 Encounter for screening for malignant neoplasm of colon: Secondary | ICD-10-CM

## 2018-01-31 DIAGNOSIS — R41840 Attention and concentration deficit: Secondary | ICD-10-CM

## 2018-01-31 DIAGNOSIS — D649 Anemia, unspecified: Secondary | ICD-10-CM | POA: Insufficient documentation

## 2018-01-31 DIAGNOSIS — M2391 Unspecified internal derangement of right knee: Secondary | ICD-10-CM

## 2018-01-31 DIAGNOSIS — R748 Abnormal levels of other serum enzymes: Secondary | ICD-10-CM

## 2018-01-31 DIAGNOSIS — M2550 Pain in unspecified joint: Secondary | ICD-10-CM | POA: Insufficient documentation

## 2018-01-31 DIAGNOSIS — Z1239 Encounter for other screening for malignant neoplasm of breast: Secondary | ICD-10-CM

## 2018-01-31 DIAGNOSIS — N819 Female genital prolapse, unspecified: Secondary | ICD-10-CM

## 2018-01-31 DIAGNOSIS — F329 Major depressive disorder, single episode, unspecified: Secondary | ICD-10-CM

## 2018-01-31 DIAGNOSIS — E669 Obesity, unspecified: Secondary | ICD-10-CM | POA: Insufficient documentation

## 2018-01-31 NOTE — Patient Instructions (Addendum)
Please do eat yogurt or kimchi or take a probiotic daily for the next month We want to replace the healthy germs in the gut If you notice foul, watery diarrhea in the next two months, schedule an appointment RIGHT AWAY or go to an urgent care or the emergency room if a holiday or over a weekend We'll have you see Dr. Maggie Font to see if you have ADHD Return to see me a few weeks after your assessment We'll get labs today If you have not heard anything from my staff in a week about any orders/referrals/studies from today, please contact us here to follow-up [phone]  Check out the information at familydoctor.org entitled "Nutrition for Weight Loss: What You Need to Know about Fad Diets" Try to lose between 1-2 pounds per week by taking in fewer calories and burning off more calories You can succeed by limiting portions, limiting foods dense in calories and fat, becoming more active, and drinking 8 glasses of water a day (64 ounces) Don't skip meals, especially breakfast, as skipping meals may alter your metabolism Do not use over-the-counter weight loss pills or gimmicks that claim rapid weight loss A healthy BMI (or body mass index) is between 18.5 and 24.9 You can calculate your ideal BMI at the [place] website [employer]Mon[employer]fr

## 2018-01-31 NOTE — Assessment & Plan Note (Signed)
Switch the SSRI to night-time dosing to see if that reduces hunger

## 2018-01-31 NOTE — Assessment & Plan Note (Signed)
Check TSH 

## 2018-01-31 NOTE — Assessment & Plan Note (Signed)
Encouraged weight loss; healthy eating

## 2018-01-31 NOTE — Assessment & Plan Note (Signed)
Managed by gyn previously

## 2018-01-31 NOTE — Assessment & Plan Note (Signed)
Check lipids; try to limit saturated fats 

## 2018-01-31 NOTE — Assessment & Plan Note (Signed)
Check CBC, iron, TIBC, ferritin

## 2018-01-31 NOTE — Assessment & Plan Note (Signed)
Managed by ortho 

## 2018-01-31 NOTE — Progress Notes (Signed)
BP 126/70   Pulse 90   Temp 98.1 F (36.7 C) (Oral)   Resp 16   Ht 5\' 7"  (1.702 m)   Wt 212 lb 6.4 oz (96.3 kg)   SpO2 96%   BMI 33.27 kg/m    Subjective:    Patient ID: Jennifer Whitney, female    DOB: 08/21/1960, 57 y.o.   MRN: [id number]  HPI: Jennifer Whitney is a 57 y.o. female  Chief Complaint  Patient presents with  . New Patient (Initial Visit)  . Obesity  . ADHD    wants to see about getting diagnosed    HPI Patient is here to establish care  She feels like the absent minded professor Multitasks, but she'll be talking and says something out of the blue Can't keep concentration Would like to be tested for ADHD Gets very side-tracked Thinks it runs in the family; probably her brother The girls in her family did better in school Father was an alcoholic, so only one sister finished high school; patient left at age 17  Taking citalopram for depression; on that for about 8 years; working well; no previous issues with depression, no hospitalizations related to depression  Hypothyroidism On thyroid med, was seeing Dr. Marylu Lund; she found it on the first visit; was gaining weight lately; stays hungry; not sure if the citalopram  Urine symptoms are all better; has bladder prolapse; tried a pessary and thinks that caused an infection; pressure down there; RBCs on prior two urines in [facility] were 0-3; sometimes can urinate fine; was seeing Dr. Elnoria Howard (urologist); bladder mesh wasn't holding  She had pneumonia 3 weeks ago; clincally diagnosed, no CXR; treated with levaquin; was at a tournament and there was a lot of pollen; forgot her flonase that trip; lifted a small child over the fence, hoisted her over and pinched a nerve or something; gets muscle spasms some, restless legs; last CBC was 2015 and H/H 9.6/30.3; not much red meat Last H/H at [facility] 13.4/39.6  Knee problems; seeing Dr. Mack Guise; plantar fasciitis in the left foot; falling apart; joints  ache; has to take meloxicam regularly  Depression screen [facility] 2/9 01/31/2018  Decreased Interest 0  Down, Depressed, Hopeless 0  PHQ - 2 Score 0    Relevant past medical, surgical, family and social history reviewed Past Medical History:  Diagnosis Date  . Acquired hypothyroidism 11/25/2015  . Allergic rhinitis 06/14/2017  . Arthritis   . 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 Chronic back pain 06/28/2017  . Depression 11/25/2015  . 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 High cholesterol 06/17/2015   Overview:  The 10-year ASCVD risk score Mikey Bussing DC Jr., et al., 2013) is: 2.4%-2016  . Menopausal syndrome (hot flashes) 06/14/2017  . Pelvic pressure in female 06/28/2017  . Pneumonia   . Thyroid disease   . Urinary incontinence in female 06/14/2017  . 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 ABDOMINAL HYSTERECTOMY    . Bladder Mesh  2014  . TUBAL LIGATION     Family History  Problem Relation Age of Onset  . Heart failure Mother   . Lung cancer Mother   . Heart disease Mother   . Stroke Father   . Heart attack Father   . Hypertension Sister   . Hypertension Brother   . Arrhythmia Daughter        SVT  . Heart disease Paternal Grandmother   . Emphysema Paternal Grandfather   .  Hypertension Sister   . Prostate cancer Neg Hx   . Kidney cancer Neg Hx    Social History   Tobacco Use  . Smoking status: Former Research scientist (life sciences)  . Smokeless tobacco: Never Used  Substance Use Topics  . Alcohol use: No  . Drug use: No    Interim medical history since last visit reviewed. Allergies and medications reviewed  Review of Systems Per HPI unless specifically indicated above     Objective:    BP 126/70   Pulse 90   Temp 98.1 F (36.7 C) (Oral)   Resp 16   Ht 5\' 7"  (1.702 m)   Wt 212 lb 6.4 oz (96.3 kg)   SpO2 96%   BMI 33.27 kg/m   Wt Readings from Last 3 Encounters:  01/31/18 212 lb 6.4 oz (96.3 kg)  06/28/17 201 lb (91.2 kg)    05/09/16 190 lb (86.2 kg)    Physical Exam  Constitutional: She appears well-developed and well-nourished. No distress.  HENT:  Head: Normocephalic and atraumatic.  Eyes: EOM are normal. No scleral icterus.  Neck: No thyromegaly present.  Cardiovascular: Normal rate and regular rhythm.  No murmur heard. Pulmonary/Chest: Effort normal and breath sounds normal. No respiratory distress. She has no wheezes.  Abdominal: Soft. Bowel sounds are normal. She exhibits no distension.  Musculoskeletal: She exhibits no edema.  Neurological: She is alert. She exhibits normal muscle tone.  Skin: Skin is warm and dry. She is not diaphoretic. No pallor.  Psychiatric: She has a normal mood and affect. Her behavior is normal. Judgment and thought content normal.       Assessment & Plan:   Problem List Items Addressed This Visit      Endocrine   Acquired hypothyroidism (Chronic)    Check TSH      Relevant Orders   TSH (Completed)     Musculoskeletal and Integument   Derangement of knee    Managed by ortho        Genitourinary   Vaginal vault prolapse    Managed by gyn previously        Other   Obesity (BMI 30.0-34.9)    Encouraged weight loss; healthy eating      High cholesterol    Check lipids; try to limit saturated fats      Relevant Orders   Lipid panel (Completed)   Depression (Chronic)    Switch the SSRI to night-time dosing to see if that reduces hunger      Arthralgia    Check vit D      Relevant Orders   VITAMIN D 25 Hydroxy (Vit-D Deficiency, Fractures) (Completed)   Anemia    Check CBC, iron, TIBC, ferritin      Relevant Orders   Vitamin B12 (Completed)    Other Visit Diagnoses    Attention and concentration deficit    -  Primary   refer to psychologist for testing; option also for referral to psych; she opts for psychologist, then back to me to discuss meds   Relevant Orders   Ambulatory referral to Psychology   Alkaline phosphatase elevation        Relevant Orders   COMPLETE METABOLIC PANEL WITH GFR (Completed)   Screen for colon cancer       discussed options; she wishes to do Cologuard   Relevant Orders   Cologuard   Screening for breast cancer       ordered mammogram   Relevant Orders   MM Digital Screening  Follow up plan: No follow-ups on file.  An after-visit summary was printed and given to the patient at [facility].  Please see the patient instructions which may contain other information and recommendations beyond what is mentioned above in the assessment and plan.  No orders of the defined types were placed in this encounter.   Orders Placed This Encounter  Procedures  . MM Digital Screening  . Cologuard  . COMPLETE METABOLIC PANEL WITH GFR  . Lipid panel  . TSH  . VITAMIN D 25 Hydroxy (Vit-D Deficiency, Fractures)  . Vitamin B12  . Ambulatory referral to Psychology

## 2018-01-31 NOTE — Assessment & Plan Note (Signed)
Check vit D 

## 2018-02-01 LAB — LIPID PANEL
CHOLESTEROL: 210 mg/dL — AB (ref <200)
HDL: 36 mg/dL — ABNORMAL LOW (ref >50)
LDL Cholesterol (Calc): 142 mg/dL (calc) — ABNORMAL HIGH
NON-HDL CHOLESTEROL (CALC): 174 mg/dL — AB (ref <130)
Total CHOL/HDL Ratio: 5.8 (calc) — ABNORMAL HIGH (ref <5.0)
Triglycerides: 184 mg/dL — ABNORMAL HIGH (ref <150)

## 2018-02-01 LAB — COMPLETE METABOLIC PANEL WITHOUT GFR
AG Ratio: 1.7 (calc) (ref 1.0–2.5)
ALT: 40 U/L — ABNORMAL HIGH (ref 6–29)
AST: 27 U/L (ref 10–35)
Albumin: 4.4 g/dL (ref 3.6–5.1)
Alkaline phosphatase (APISO): 101 U/L (ref 33–130)
BUN: 14 mg/dL (ref 7–25)
CO2: 29 mmol/L (ref 20–32)
Calcium: 9.9 mg/dL (ref 8.6–10.4)
Chloride: 102 mmol/L (ref 98–110)
Creat: 0.96 mg/dL (ref 0.50–1.05)
GFR, Est African American: 77 mL/min/1.73m2
GFR, Est Non African American: 67 mL/min/1.73m2
Globulin: 2.6 g/dL (ref 1.9–3.7)
Glucose, Bld: 80 mg/dL (ref 65–139)
Potassium: 4.2 mmol/L (ref 3.5–5.3)
Sodium: 140 mmol/L (ref 135–146)
Total Bilirubin: 0.3 mg/dL (ref 0.2–1.2)
Total Protein: 7 g/dL (ref 6.1–8.1)

## 2018-02-01 LAB — VITAMIN B12: Vitamin B-12: 440 pg/mL (ref 200–1100)

## 2018-02-01 LAB — TSH: TSH: 3.8 m[IU]/L

## 2018-02-01 LAB — VITAMIN D 25 HYDROXY (VIT D DEFICIENCY, FRACTURES): VIT D 25 HYDROXY: 35 ng/mL (ref 30–100)

## 2018-03-10 ENCOUNTER — Ambulatory Visit
Admission: RE | Admit: 2018-03-10 | Discharge: 2018-03-10 | Disposition: A | Discharge status: Home / Self Care | Payer: BLUE CROSS/BLUE SHIELD | Source: Ambulatory Visit | Attending: Family Medicine | Admitting: Family Medicine

## 2018-03-10 DIAGNOSIS — Z1231 Encounter for screening mammogram for malignant neoplasm of breast: Secondary | ICD-10-CM | POA: Diagnosis not present

## 2018-03-10 DIAGNOSIS — Z1239 Encounter for other screening for malignant neoplasm of breast: Secondary | ICD-10-CM

## 2018-04-02 ENCOUNTER — Telehealth: Payer: Self-pay | Admitting: Family Medicine

## 2018-04-02 DIAGNOSIS — D509 Iron deficiency anemia, unspecified: Secondary | ICD-10-CM

## 2018-04-02 DIAGNOSIS — R252 Cramp and spasm: Secondary | ICD-10-CM

## 2018-04-02 DIAGNOSIS — Z1211 Encounter for screening for malignant neoplasm of colon: Secondary | ICD-10-CM

## 2018-04-02 NOTE — Telephone Encounter (Signed)
She'll rather do the colonoscopy; referral put in for GI She's having leg cramps; will get Mg2+ Will get CBC with ferritin for hx of anemia, iron deficiency with pregnancy

## 2018-04-08 ENCOUNTER — Other Ambulatory Visit: Payer: Self-pay

## 2018-04-08 DIAGNOSIS — Z1211 Encounter for screening for malignant neoplasm of colon: Secondary | ICD-10-CM

## 2018-04-15 ENCOUNTER — Other Ambulatory Visit: Payer: Self-pay

## 2018-04-21 NOTE — Discharge Instructions (Signed)
General Anesthesia, Adult, Care After °These instructions provide you with information about caring for yourself after your procedure. Your health care provider may also give you more specific instructions. Your treatment has been planned according to current medical practices, but problems sometimes occur. Call your health care provider if you have any problems or questions after your procedure. °What can I expect after the procedure? °After the procedure, it is common to have: °· Vomiting. °· A sore throat. °· Mental slowness. ° °It is common to feel: °· Nauseous. °· Cold or shivery. °· Sleepy. °· Tired. °· Sore or achy, even in parts of your body where you did not have surgery. ° °Follow these instructions at home: °For at least 24 hours after the procedure: °· Do not: °? Participate in activities where you could fall or become injured. °? Drive. °? Use heavy machinery. °? Drink alcohol. °? Take sleeping pills or medicines that cause drowsiness. °? Make important decisions or sign legal documents. °? Take care of children on your own. °· Rest. °Eating and drinking °· If you vomit, drink water, juice, or soup when you can drink without vomiting. °· Drink enough fluid to keep your urine clear or pale yellow. °· Make sure you have little or no nausea before eating solid foods. °· Follow the diet recommended by your health care provider. °General instructions °· Have a responsible adult stay with you until you are awake and alert. °· Return to your normal activities as told by your health care provider. Ask your health care provider what activities are safe for you. °· Take over-the-counter and prescription medicines only as told by your health care provider. °· If you smoke, do not smoke without supervision. °· Keep all follow-up visits as told by your health care provider. This is important. °Contact a health care provider if: °· You continue to have nausea or vomiting at home, and medicines are not helpful. °· You  cannot drink fluids or start eating again. °· You cannot urinate after 8-12 hours. °· You develop a skin rash. °· You have fever. °· You have increasing redness at the site of your procedure. °Get help right away if: °· You have difficulty breathing. °· You have chest pain. °· You have unexpected bleeding. °· You feel that you are having a life-threatening or urgent problem. °This information is not intended to replace advice given to you by your health care provider. Make sure you discuss any questions you have with your health care provider. °Document Released: 11/02/2000 Document Revised: 12/30/2015 Document Reviewed: 07/11/2015 °Elsevier Interactive Patient Education © 2018 Elsevier Inc. ° °

## 2018-04-22 ENCOUNTER — Ambulatory Visit: Payer: BLUE CROSS/BLUE SHIELD | Admitting: Anesthesiology

## 2018-04-22 ENCOUNTER — Encounter
Admission: RE | Disposition: A | Discharge status: Home / Self Care | Payer: Self-pay | Source: Ambulatory Visit | Attending: Gastroenterology

## 2018-04-22 ENCOUNTER — Ambulatory Visit
Admission: RE | Admit: 2018-04-22 | Discharge: 2018-04-22 | Disposition: A | Discharge status: Home / Self Care | Payer: BLUE CROSS/BLUE SHIELD | Source: Ambulatory Visit | Attending: Gastroenterology | Admitting: Gastroenterology

## 2018-04-22 DIAGNOSIS — K635 Polyp of colon: Secondary | ICD-10-CM

## 2018-04-22 DIAGNOSIS — F329 Major depressive disorder, single episode, unspecified: Secondary | ICD-10-CM | POA: Diagnosis not present

## 2018-04-22 DIAGNOSIS — Z87891 Personal history of nicotine dependence: Secondary | ICD-10-CM | POA: Diagnosis not present

## 2018-04-22 DIAGNOSIS — K640 First degree hemorrhoids: Secondary | ICD-10-CM | POA: Insufficient documentation

## 2018-04-22 DIAGNOSIS — E7800 Pure hypercholesterolemia, unspecified: Secondary | ICD-10-CM | POA: Diagnosis not present

## 2018-04-22 DIAGNOSIS — Z8249 Family history of ischemic heart disease and other diseases of the circulatory system: Secondary | ICD-10-CM | POA: Diagnosis not present

## 2018-04-22 DIAGNOSIS — K5730 Diverticulosis of large intestine without perforation or abscess without bleeding: Secondary | ICD-10-CM | POA: Insufficient documentation

## 2018-04-22 DIAGNOSIS — Z79899 Other long term (current) drug therapy: Secondary | ICD-10-CM | POA: Diagnosis not present

## 2018-04-22 DIAGNOSIS — D125 Benign neoplasm of sigmoid colon: Secondary | ICD-10-CM | POA: Insufficient documentation

## 2018-04-22 DIAGNOSIS — E039 Hypothyroidism, unspecified: Secondary | ICD-10-CM | POA: Diagnosis not present

## 2018-04-22 DIAGNOSIS — M1990 Unspecified osteoarthritis, unspecified site: Secondary | ICD-10-CM | POA: Insufficient documentation

## 2018-04-22 DIAGNOSIS — Z1211 Encounter for screening for malignant neoplasm of colon: Secondary | ICD-10-CM | POA: Insufficient documentation

## 2018-04-22 HISTORY — PX: COLONOSCOPY WITH PROPOFOL: SHX5780

## 2018-04-22 HISTORY — PX: POLYPECTOMY: SHX5525

## 2018-04-22 SURGERY — COLONOSCOPY WITH PROPOFOL
Anesthesia: General | Site: Rectum

## 2018-04-22 MED ORDER — OXYCODONE HCL 5 MG PO TABS: 5.0000 mg | ORAL_TABLET | Freq: Once | ORAL | Status: DC | PRN

## 2018-04-22 MED ORDER — LACTATED RINGERS IV SOLN
INTRAVENOUS | Status: DC
  Administered 2018-04-22: 09:00:00 via INTRAVENOUS

## 2018-04-22 MED ORDER — STERILE WATER FOR IRRIGATION IR SOLN
Status: DC | PRN
  Administered 2018-04-22: 10:00:00

## 2018-04-22 MED ORDER — OXYCODONE HCL 5 MG/5ML PO SOLN: 5.0000 mg | Freq: Once | ORAL | Status: DC | PRN

## 2018-04-22 MED ORDER — SODIUM CHLORIDE 0.9 % IV SOLN: INTRAVENOUS | Status: DC

## 2018-04-22 MED ORDER — LIDOCAINE HCL (CARDIAC) PF 100 MG/5ML IV SOSY
PREFILLED_SYRINGE | INTRAVENOUS | Status: DC | PRN
  Administered 2018-04-22: 40 mg via INTRAVENOUS

## 2018-04-22 MED ORDER — PROPOFOL 10 MG/ML IV BOLUS
INTRAVENOUS | Status: DC | PRN
  Administered 2018-04-22: 30 mg via INTRAVENOUS
  Administered 2018-04-22 (×4): 20 mg via INTRAVENOUS
  Administered 2018-04-22: 30 mg via INTRAVENOUS
  Administered 2018-04-22 (×2): 20 mg via INTRAVENOUS
  Administered 2018-04-22: 80 mg via INTRAVENOUS
  Administered 2018-04-22: 20 mg via INTRAVENOUS

## 2018-04-22 SURGICAL SUPPLY — 24 items
CANISTER SUCT 1200ML W/VALVE (MISCELLANEOUS) ×3 IMPLANT
CLIP HMST 235XBRD CATH ROT (MISCELLANEOUS) IMPLANT
CLIP RESOLUTION 360 11X235 (MISCELLANEOUS)
ELECT REM PT RETURN 9FT ADLT (ELECTROSURGICAL)
ELECTRODE REM PT RTRN 9FT ADLT (ELECTROSURGICAL) IMPLANT
FCP ESCP3.2XJMB 240X2.8X (MISCELLANEOUS)
FORCEPS BIOP RAD 4 LRG CAP 4 (CUTTING FORCEPS) IMPLANT
FORCEPS BIOP RJ4 240 W/NDL (MISCELLANEOUS)
FORCEPS ESCP3.2XJMB 240X2.8X (MISCELLANEOUS) IMPLANT
GOWN CVR UNV OPN BCK APRN NK (MISCELLANEOUS) ×2 IMPLANT
GOWN ISOL THUMB LOOP REG UNIV (MISCELLANEOUS) ×4
INJECTOR VARIJECT VIN23 (MISCELLANEOUS) IMPLANT
KIT DEFENDO VALVE AND CONN (KITS) IMPLANT
KIT ENDO PROCEDURE OLY (KITS) ×3 IMPLANT
MARKER SPOT ENDO TATTOO 5ML (MISCELLANEOUS) IMPLANT
PROBE APC STR FIRE (PROBE) IMPLANT
RETRIEVER NET ROTH 2.5X230 LF (MISCELLANEOUS) IMPLANT
SNARE SHORT THROW 13M SML OVAL (MISCELLANEOUS) ×3 IMPLANT
SNARE SHORT THROW 30M LRG OVAL (MISCELLANEOUS) IMPLANT
SNARE SNG USE RND 15MM (INSTRUMENTS) IMPLANT
SPOT EX ENDOSCOPIC TATTOO (MISCELLANEOUS)
TRAP ETRAP POLY (MISCELLANEOUS) ×3 IMPLANT
VARIJECT INJECTOR VIN23 (MISCELLANEOUS)
WATER STERILE IRR 250ML POUR (IV SOLUTION) ×3 IMPLANT

## 2018-04-22 NOTE — Transfer of Care (Signed)
Immediate Anesthesia Transfer of Care Note  Patient: Jennifer Whitney  Procedure(s) Performed: COLONOSCOPY WITH PROPOFOL (N/A Rectum) POLYPECTOMY (N/A Rectum)  Patient Location: PACU  Anesthesia Type: General  Level of Consciousness: awake, alert  and patient cooperative  Airway and Oxygen Therapy: Patient Spontanous Breathing and Patient connected to supplemental oxygen  Post-op Assessment: Post-op Vital signs reviewed, Patient's Cardiovascular Status Stable, Respiratory Function Stable, Patent Airway and No signs of Nausea or vomiting  Post-op Vital Signs: Reviewed and stable  Complications: No apparent anesthesia complications

## 2018-04-22 NOTE — H&P (Signed)
Lucilla Lame, MD [address] [place], [place] [address] Phone: [phone] Fax : [phone]  Primary Care Physician:  Arnetha Courser, MD Primary Gastroenterologist:  Dr. Allen Norris  Pre-Procedure History & Physical: HPI:  Jennifer Whitney is a 57 y.o. female is here for a screening colonoscopy.   Past Medical History:  Diagnosis Date  . Acquired hypothyroidism 11/25/2015  . Allergic rhinitis 06/14/2017  . Arthritis   . 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 Chronic back pain 06/28/2017  . Depression 11/25/2015  . 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 High cholesterol 06/17/2015   Overview:  The 10-year ASCVD risk score Mikey Bussing DC Jr., et al., 2013) is: 2.4%-2016  . Menopausal syndrome (hot flashes) 06/14/2017  . Pelvic pressure in female 06/28/2017  . Pneumonia   . Thyroid disease   . Urinary incontinence in female 06/14/2017  . 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 ABDOMINAL HYSTERECTOMY    . Bladder Mesh  2014  . TUBAL LIGATION      Prior to Admission medications   Medication Sig Start Date End Date Taking? Authorizing Provider  loratadine (CLARITIN) 10 MG tablet Take 10 mg by mouth daily.   Yes [provider]  citalopram (CELEXA) 20 MG tablet Take 20 mg by mouth daily.    [provider]  estradiol (ESTRACE) 0.5 MG tablet Take by mouth. 03/12/17   [provider]  fluticasone (FLONASE) 50 MCG/ACT nasal spray Place 2 sprays into the nose as needed. 12/08/17 12/08/18  [provider]  levothyroxine (SYNTHROID, LEVOTHROID) 25 MCG tablet Take 25 mcg by mouth daily before breakfast.    [provider]  meloxicam (MOBIC) 15 MG tablet Take 15 mg by mouth daily.    [provider]    Allergies as of 04/08/2018  . (No Known Allergies)    Family History  Problem Relation Age of Onset  . Heart failure Mother   . Lung cancer Mother   . Heart disease Mother   . Stroke  Father   . Heart attack Father   . Hypertension Sister   . Hypertension Brother   . Arrhythmia Daughter        SVT  . Heart disease Paternal Grandmother   . Emphysema Paternal Grandfather   . Hypertension Sister   . Prostate cancer Neg Hx   . Kidney cancer Neg Hx   . Breast cancer Neg Hx     Social History   Socioeconomic History  . Marital status: Married    Spouse name: Not on file  . Number of children: Not on file  . Years of education: Not on file  . Highest education level: Not on file  Occupational History  . Not on file  Social Needs  . Financial resource strain: Not on file  . Food insecurity:    Worry: Not on file    Inability: Not on file  . Transportation needs:    Medical: Not on file    Non-medical: Not on file  Tobacco Use  . Smoking status: Former Research scientist (life sciences)  . Smokeless tobacco: Never Used  Substance and Sexual Activity  . Alcohol use: No  . Drug use: No  . Sexual activity: Not Currently  Lifestyle  . Physical activity:    Days per week: Not on file    Minutes per session: Not on file  . Stress: Not on file  Relationships  . Social connections:  Talks on phone: Not on file    Gets together: Not on file    Attends religious service: Not on file    Active member of club or organization: Not on file    Attends meetings of clubs or organizations: Not on file    Relationship status: Not on file  . Intimate partner violence:    Fear of current or ex partner: Not on file    Emotionally abused: Not on file    Physically abused: Not on file    Forced sexual activity: Not on file  Other Topics Concern  . Not on file  Social History Narrative  . Not on file    Review of Systems: See HPI, otherwise negative ROS  Physical Exam: Ht 5\' 8"  (1.727 m)   Wt 90.7 kg   BMI 30.41 kg/m  General:   Alert,  pleasant and cooperative in NAD Head:  Normocephalic and atraumatic. Neck:  Supple; no masses or thyromegaly. Lungs:  Clear throughout to  auscultation.    Heart:  Regular rate and rhythm. Abdomen:  Soft, nontender and nondistended. Normal bowel sounds, without guarding, and without rebound.   Neurologic:  Alert and  oriented x4;  grossly normal neurologically.  Impression/Plan: NAVA SONG is now here to undergo a screening colonoscopy.  Risks, benefits, and alternatives regarding colonoscopy have been reviewed with the patient.  Questions have been answered.  All parties agreeable.

## 2018-04-22 NOTE — Anesthesia Preprocedure Evaluation (Addendum)
Anesthesia Evaluation  Patient identified by MRN, date of birth, ID band Patient awake    Reviewed: Allergy & Precautions, H&P , NPO status , Patient's Chart, lab work & pertinent test results  History of Anesthesia Complications Negative for: history of anesthetic complications  Airway Mallampati: I  TM Distance: >3 FB Neck ROM: full    Dental no notable dental hx.    Pulmonary former smoker,    Pulmonary exam normal breath sounds clear to auscultation       Cardiovascular negative cardio ROS Normal cardiovascular exam     Neuro/Psych    GI/Hepatic negative GI ROS, Neg liver ROS,   Endo/Other  Hypothyroidism   Renal/GU negative Renal ROS  negative genitourinary   Musculoskeletal   Abdominal   Peds  Hematology   Anesthesia Other Findings   Reproductive/Obstetrics                            Anesthesia Physical Anesthesia Plan  ASA: II  Anesthesia Plan: General   Post-op Pain Management:    Induction:   PONV Risk Score and Plan:   Airway Management Planned:   Additional Equipment:   Intra-op Plan:   Post-operative Plan:   Informed Consent: I have reviewed the patients History and Physical, chart, labs and discussed the procedure including the risks, benefits and alternatives for the proposed anesthesia with the patient or authorized representative who has indicated his/her understanding and acceptance.     Plan Discussed with:   Anesthesia Plan Comments:         Anesthesia Quick Evaluation

## 2018-04-22 NOTE — Anesthesia Postprocedure Evaluation (Addendum)
Anesthesia Post Note  Patient: Jennifer Whitney  Procedure(s) Performed: COLONOSCOPY WITH PROPOFOL (N/A Rectum) POLYPECTOMY (N/A Rectum)  Patient location during evaluation: PACU Anesthesia Type: General Level of consciousness: awake and alert Pain management: pain level controlled Respiratory status: spontaneous breathing Cardiovascular status: blood pressure returned to baseline Anesthetic complications: no    Jaci Standard, III,  Delayla Hoffmaster D

## 2018-04-22 NOTE — Anesthesia Procedure Notes (Signed)
Performed by: Haaris Metallo, CRNA Pre-anesthesia Checklist: Patient identified, Emergency Drugs available, Suction available, Timeout performed and Patient being monitored Patient Re-evaluated:Patient Re-evaluated prior to induction Oxygen Delivery Method: Nasal cannula Placement Confirmation: positive ETCO2       

## 2018-04-22 NOTE — Op Note (Signed)
[facility]astroenter[facility] Patient Name: Jennifer Whitney Procedure Date: 04/22/2018 9:28 AM MRN: [id number] Account #: [id number] Date of Birth: [birth year] Admit Type: Outpatient Age: 57 Room: [facility] OR ROOM 01 Gender: Female Note Status: Finalized Procedure:            Colonoscopy Indications:          Screening for colorectal malignant neoplasm Providers:            Lucilla Lame MD, MD Referring MD:         Arnetha Courser (Referring MD) Medicines:            Propofol per Anesthesia Complications:        No immediate complications. Procedure:            Pre-Anesthesia Assessment:                       - Prior to the procedure, a History and Physical was                        performed, and patient medications and allergies were                        reviewed. The patient's tolerance of previous                        anesthesia was also reviewed. The risks and benefits of                        the procedure and the sedation options and risks were                        discussed with the patient. All questions were                        answered, and informed consent was obtained. Prior                        Anticoagulants: The patient has taken no previous                        anticoagulant or antiplatelet agents. ASA Grade                        Assessment: II - A patient with mild systemic disease.                        After reviewing the risks and benefits, the patient was                        deemed in satisfactory condition to undergo the                        procedure.                       After obtaining informed consent, the colonoscope was                        passed under direct vision. Throughout the procedure,  the patient's blood pressure, pulse, and oxygen                        saturations were monitored continuously. The was                        introduced through the anus and advanced to the the             cecum, identified by appendiceal orifice and ileocecal                        valve. The colonoscopy was performed without                        difficulty. The patient tolerated the procedure well.                        The quality of the bowel preparation was excellent. Findings:      The perianal and digital rectal examinations were normal.      A 7 mm polyp was found in the sigmoid colon. The polyp was pedunculated.       The polyp was removed with a cold snare. Resection and retrieval were       complete.      Multiple small-mouthed diverticula were found in the sigmoid colon.      Non-bleeding internal hemorrhoids were found during retroflexion. The       hemorrhoids were Grade I (internal hemorrhoids that do not prolapse). Impression:           - One 7 mm polyp in the sigmoid colon, removed with a                        cold snare. Resected and retrieved.                       - Diverticulosis in the sigmoid colon.                       - Non-bleeding internal hemorrhoids. Recommendation:       - Discharge patient to home.                       - Resume previous diet.                       - Continue present medications.                       - Await pathology results.                       - Repeat colonoscopy in 5 years if polyp adenoma and 10                        years if hyperplastic Procedure Code(s):    --- Professional ---                       [phone], Colonoscopy, flexible; with removal of tumor(s),                        polyp(s), or other lesion(s) by snare technique Diagnosis  Code(s):    --- Professional ---                       Z12.11, Encounter for screening for malignant neoplasm                        of colon                       D12.5, Benign neoplasm of sigmoid colon CPT copyright 2017 American Medical Association. All rights reserved. The codes documented in this report are preliminary and upon coder review may  be revised to meet current  compliance requirements. Lucilla Lame MD, MD 04/22/2018 9:54:25 AM This report has been signed electronically. Number of Addenda: 0 Note Initiated On: 04/22/2018 9:28 AM Scope Withdrawal Time: 0 hours 6 minutes 55 seconds  Total Procedure Duration: 0 hours 15 minutes 16 seconds       [facility]

## 2018-04-25 ENCOUNTER — Encounter: Payer: Self-pay | Admitting: Gastroenterology

## 2018-04-28 ENCOUNTER — Encounter: Payer: Self-pay | Admitting: Gastroenterology

## 2018-05-11 ENCOUNTER — Other Ambulatory Visit: Payer: Self-pay

## 2018-05-11 ENCOUNTER — Ambulatory Visit (INDEPENDENT_AMBULATORY_CARE_PROVIDER_SITE_OTHER): Payer: BLUE CROSS/BLUE SHIELD

## 2018-05-11 DIAGNOSIS — R252 Cramp and spasm: Secondary | ICD-10-CM

## 2018-05-11 DIAGNOSIS — D509 Iron deficiency anemia, unspecified: Secondary | ICD-10-CM

## 2018-05-11 DIAGNOSIS — Z23 Encounter for immunization: Secondary | ICD-10-CM | POA: Diagnosis not present

## 2018-05-12 LAB — CBC WITH DIFFERENTIAL/PLATELET
BASOS ABS: 62 {cells}/uL (ref 0–200)
BASOS PCT: 1.3 %
EOS ABS: 101 {cells}/uL (ref 15–500)
Eosinophils Relative: 2.1 %
HCT: 38.6 % (ref 35.0–45.0)
HEMOGLOBIN: 13.2 g/dL (ref 11.7–15.5)
Lymphs Abs: 1320 cells/uL (ref 850–3900)
MCH: 31 pg (ref 27.0–33.0)
MCHC: 34.2 g/dL (ref 32.0–36.0)
MCV: 90.6 fL (ref 80.0–100.0)
MPV: 10.5 fL (ref 7.5–12.5)
Monocytes Relative: 9.9 %
NEUTROS ABS: 2842 {cells}/uL (ref 1500–7800)
Neutrophils Relative %: 59.2 %
Platelets: 232 10*3/uL (ref 140–400)
RBC: 4.26 10*6/uL (ref 3.80–5.10)
RDW: 14.6 % (ref 11.0–15.0)
Total Lymphocyte: 27.5 %
WBC: 4.8 10*3/uL (ref 3.8–10.8)
WBCMIX: 475 {cells}/uL (ref 200–950)

## 2018-05-12 LAB — IRON,TIBC AND FERRITIN PANEL
%SAT: 25 % (ref 16–45)
FERRITIN: 9 ng/mL — AB (ref 16–232)
Iron: 86 ug/dL (ref 45–160)
TIBC: 348 ug/dL (ref 250–450)

## 2018-05-12 LAB — MAGNESIUM: Magnesium: 2.1 mg/dL (ref 1.5–2.5)

## 2018-06-02 ENCOUNTER — Telehealth: Payer: Self-pay | Admitting: Family Medicine

## 2018-06-02 NOTE — Telephone Encounter (Signed)
Dr. Sanda Klein does not prescribe the patient Thyroid medication. She goes to Dr. Marylu Lund. I have called [facility] and let them know to resend medication refill request to correct physician.

## 2018-06-02 NOTE — Telephone Encounter (Unsigned)
Copied from [place] [id number]. Topic: Quick Communication - Rx Refill/Question >> Jun 02, 2018 12:44 PM Judyann Munson wrote: Medication: levothyroxine (SYNTHROID, LEVOTHROID) 25 MCG tablet    Has the patient contacted their pharmacy? No   Preferred Pharmacy (with phone number or street name):[facility] (N), [place] - [place] [phone] (Phone) [phone] (Fax)

## 2018-06-03 ENCOUNTER — Ambulatory Visit (INDEPENDENT_AMBULATORY_CARE_PROVIDER_SITE_OTHER): Payer: BLUE CROSS/BLUE SHIELD | Admitting: Psychology

## 2018-06-03 DIAGNOSIS — F3341 Major depressive disorder, recurrent, in partial remission: Secondary | ICD-10-CM

## 2018-06-03 DIAGNOSIS — F909 Attention-deficit hyperactivity disorder, unspecified type: Secondary | ICD-10-CM | POA: Diagnosis not present

## 2018-06-09 ENCOUNTER — Other Ambulatory Visit: Payer: Self-pay | Admitting: Family Medicine

## 2018-06-09 MED ORDER — CITALOPRAM HYDROBROMIDE 20 MG PO TABS: 20.0000 mg | ORAL_TABLET | Freq: Every day | ORAL | 1 refills | Status: DC

## 2018-06-09 NOTE — Telephone Encounter (Signed)
Copied from [place]. Topic: Quick Communication - Rx Refill/Question >> Jun 09, 2018 10:17 AM Reyne Dumas L wrote: Medication: citalopram (CELEXA) 20 MG tablet  Has the patient contacted their pharmacy? No - states this will be the first time Dr. Sanda Klein has RXd (Agent: If no, request that the patient contact the pharmacy for the refill.) (Agent: If yes, when and what did the pharmacy advise?)  Preferred Pharmacy (with phone number or street name): [facility] (N), [place] [phone] (Phone) [phone] (Fax)  Agent: Please be advised that RX refills may take up to 3 business days. We ask that you follow-up with your pharmacy.

## 2018-06-15 ENCOUNTER — Other Ambulatory Visit: Payer: Self-pay

## 2018-06-15 ENCOUNTER — Encounter: Payer: Self-pay | Admitting: Urology

## 2018-06-15 ENCOUNTER — Ambulatory Visit (INDEPENDENT_AMBULATORY_CARE_PROVIDER_SITE_OTHER): Payer: BLUE CROSS/BLUE SHIELD | Admitting: Urology

## 2018-06-15 VITALS — BP 116/71 | HR 76 | Ht 68.0 in | Wt 209.7 lb

## 2018-06-15 DIAGNOSIS — N368 Other specified disorders of urethra: Secondary | ICD-10-CM | POA: Diagnosis not present

## 2018-06-15 DIAGNOSIS — R399 Unspecified symptoms and signs involving the genitourinary system: Secondary | ICD-10-CM

## 2018-06-15 DIAGNOSIS — R109 Unspecified abdominal pain: Secondary | ICD-10-CM | POA: Diagnosis not present

## 2018-06-15 DIAGNOSIS — N8189 Other female genital prolapse: Secondary | ICD-10-CM | POA: Diagnosis not present

## 2018-06-15 LAB — URINALYSIS, COMPLETE
BILIRUBIN UA: NEGATIVE
GLUCOSE, UA: NEGATIVE
KETONES UA: NEGATIVE
Nitrite, UA: NEGATIVE
PROTEIN UA: NEGATIVE
RBC UA: NEGATIVE
SPEC GRAV UA: 1.01 (ref 1.005–1.030)
UUROB: 0.2 mg/dL (ref 0.2–1.0)
pH, UA: 6 (ref 5.0–7.5)

## 2018-06-15 LAB — MICROSCOPIC EXAMINATION: WBC UA: NONE SEEN /HPF (ref 0–5)

## 2018-06-15 NOTE — Progress Notes (Signed)
06/15/2018 10:13 AM   Jennifer Whitney 20-Apr-1961 [id number]  Referring provider: Arnetha Courser, MD [address] [place], [place] [address]  Chief Complaint  Patient presents with  . Flank Pain    HPI: Patient is a 57 year old female with a history of urethral spotting, prolapse and LU TS who presents today complaining of flank pain.  Urethral spotting Patient presented to our office last year for occasional blood on toilet tissue when wiping after urination and it was recommended that she have a cystoscopy.  She has not yet had the cysto.    Prolapse She underwent a pubovaginal sling with MiniArc in May 2015 in conjunction with laparoscopic hysterectomy, BSO and ureterosacral ligament plication by gynecology and urology (Dr. Elnoria Howard)  Stress incontinence resolved however she had recurrent prolapse.  She is currently being seen by Dr. Salome Holmes with [facility] who has recommended surgical management today with an anterior repair/SSLF vs robotic sacrocolpopexy.   She is currently being managed with a pessary.  She feels she empties much better with her pessary in place.   LUTS She is complaining of frequency, nocturia, incontinence, hesitancy and straining to urinate.    Today, she is having burning and achyness in her flanks bilateral for one month.  She states in starts in the morning and continues all day long at the same intensity.  5-6/10 pain.  Nothing seems to make it worse and nothing makes it better.  Patient denies any gross hematuria, dysuria or suprapubic/flank pain.  Patient denies any fevers, chills, nausea or vomiting. Her UA is bland.  Her PVR is 0 mL.   PMH: Past Medical History:  Diagnosis Date  . Acquired hypothyroidism 11/25/2015  . Allergic rhinitis 06/14/2017  . Arthritis   . 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 Chronic back pain 06/28/2017  . Depression 11/25/2015  . 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 High cholesterol 06/17/2015   Overview:  The  10-year ASCVD risk score Mikey Bussing DC Jr., et al., 2013) is: 2.4%-2016  . Menopausal syndrome (hot flashes) 06/14/2017  . Pelvic pressure in female 06/28/2017  . Pneumonia   . Thyroid disease   . Urinary incontinence in female 06/14/2017  . 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 ABDOMINAL HYSTERECTOMY    . Bladder Mesh  2014  . COLONOSCOPY WITH PROPOFOL N/A 04/22/2018   Procedure: COLONOSCOPY WITH PROPOFOL;  Surgeon: Lucilla Lame, MD;  Location: [facility];  Service: Endoscopy;  Laterality: N/A;  . POLYPECTOMY N/A 04/22/2018   Procedure: POLYPECTOMY;  Surgeon: Lucilla Lame, MD;  Location: [facility] [facility];  Service: Endoscopy;  Laterality: N/A;  . TUBAL LIGATION      Home Medications:  Allergies as of 06/15/2018   No Known Allergies     Medication List        Accurate as of 06/15/18 10:13 AM. Always use your most recent med list.          citalopram 20 MG tablet Commonly known as:  CELEXA Take 1 tablet (20 mg total) by mouth daily.   estradiol 0.5 MG tablet Commonly known as:  ESTRACE Take by mouth.   fluticasone 50 MCG/ACT nasal spray Commonly known as:  FLONASE Place 2 sprays into the nose as needed.   levothyroxine 25 MCG tablet Commonly known as:  SYNTHROID, LEVOTHROID Take 25 mcg by mouth daily before breakfast.   loratadine 10 MG tablet Commonly known as:  CLARITIN Take 10 mg by mouth daily.   meloxicam 15 MG tablet Commonly known as:  MOBIC Take 15 mg by mouth daily.       Allergies: No Known Allergies  Family History: Family History  Problem Relation Age of Onset  . Heart failure Mother   . Lung cancer Mother   . Heart disease Mother   . Stroke Father   . Heart attack Father   . Hypertension Sister   . Hypertension Brother   . Arrhythmia Daughter        SVT  . Heart disease Paternal Grandmother   . Emphysema Paternal Grandfather   . Hypertension Sister   .  Prostate cancer Neg Hx   . Kidney cancer Neg Hx   . Breast cancer Neg Hx     Social History:  reports that she has quit smoking. She has never used smokeless tobacco. She reports that she does not drink alcohol or use drugs.  ROS: Otherwise noncontributory except as per the HPI. See flow sheet   Physical Exam: BP 116/71   Pulse 76   Ht 5\' 8"  (1.727 m)   Wt 209 lb 11.2 oz (95.1 kg)   BMI 31.88 kg/m   Constitutional: Well nourished. Alert and oriented, No acute distress. HEENT: [place] AT, moist mucus membranes. Trachea midline, no masses. Cardiovascular: No clubbing, cyanosis, or edema. Respiratory: Normal respiratory effort, no increased work of breathing. GI: Abdomen is soft, non tender, non distended, no abdominal masses. Liver and spleen not palpable.  No hernias appreciated.  Stool sample for occult testing is not indicated.   GU: No CVA tenderness.  No bladder fullness or masses.  Atrophic external genitalia, normal pubic hair distribution, no lesions.  Normal urethral meatus, no lesions, no prolapse, no discharge.   No urethral masses, tenderness and/or tenderness. No bladder fullness, tenderness or masses. Pale vagina mucosa, fair estrogen effect, no discharge, no lesions, poor pelvic support, grade III cystocele noted, there are two small ulcer lesions on the cystocele that may explain the spotting the lesions are likely due to friction as the bladder is outside the vaginal introitus and no rectocele noted.  Cervix, uterus and adnexa are surgically absent.  Anus and perineum are without rashes or lesions.    Skin: No rashes, bruises or suspicious lesions. Lymph: No cervical or inguinal adenopathy. Neurologic: Grossly intact, no focal deficits, moving all 4 extremities. Psychiatric: Normal mood and affect.  Laboratory Data: Lab Results  Component Value Date   WBC 4.8 05/11/2018   HGB 13.2 05/11/2018   HCT 38.6 05/11/2018   MCV 90.6 05/11/2018   PLT 232 05/11/2018    Lab  Results  Component Value Date   CREATININE 0.96 01/31/2018    Urinalysis [place].  See Epic.  Lab Results  Component Value Date   LABMICR See below: 06/28/2017   WBCUA 6-10 (A) 06/28/2017   RBCUA 11-30 (A) 06/28/2017   LABEPIT 0-10 06/28/2017   MUCUS Present (A) 06/28/2017   BACTERIA Few (A) 06/28/2017   I have reviewed the labs.  Assessment & Plan:    1. Flank pain Likely muscle skeletal as patient describes the pain as burning and does engage in heavy labor for occupation UA is bland Will obtain a RUS to evaluate for possible hydronephrosis due to her prolaspse  2.  Urethral bleeding Still spotting, has not had a cysto May be the result of the ulcer type lesions seen in the vaginal canal in conjunction with the prolapse  3. Prolapse Appointment to be scheduled with Dr. Matilde Sprang  4. LU TS Likely due to cystocele    Zara Council, [facility]  [place] [address], [place], [place] [address] [phone]

## 2018-06-18 LAB — CULTURE, URINE COMPREHENSIVE

## 2018-06-22 ENCOUNTER — Ambulatory Visit: Payer: BLUE CROSS/BLUE SHIELD

## 2018-07-04 ENCOUNTER — Telehealth: Payer: Self-pay | Admitting: Urology

## 2018-07-04 NOTE — Telephone Encounter (Signed)
Would you please check on the status of her RUS?  I have also discussed her case with Dr. Matilde Sprang and he would like to see her to evaluate for a possible sacral colpopexy.

## 2018-07-06 NOTE — Telephone Encounter (Signed)
The patient cx her app and didn't want to reschd it.    Sharyn Lull

## 2018-07-15 ENCOUNTER — Ambulatory Visit (INDEPENDENT_AMBULATORY_CARE_PROVIDER_SITE_OTHER): Payer: BLUE CROSS/BLUE SHIELD | Admitting: Psychology

## 2018-07-15 DIAGNOSIS — F902 Attention-deficit hyperactivity disorder, combined type: Secondary | ICD-10-CM | POA: Diagnosis not present

## 2018-07-15 DIAGNOSIS — F3342 Major depressive disorder, recurrent, in full remission: Secondary | ICD-10-CM

## 2018-07-19 ENCOUNTER — Encounter: Payer: Self-pay | Admitting: Family Medicine

## 2018-07-19 ENCOUNTER — Ambulatory Visit: Payer: BLUE CROSS/BLUE SHIELD | Admitting: Family Medicine

## 2018-07-19 VITALS — BP 126/80 | HR 82 | Temp 98.1°F | Ht 68.0 in | Wt 207.0 lb

## 2018-07-19 DIAGNOSIS — F329 Major depressive disorder, single episode, unspecified: Secondary | ICD-10-CM

## 2018-07-19 DIAGNOSIS — H9313 Tinnitus, bilateral: Secondary | ICD-10-CM

## 2018-07-19 DIAGNOSIS — R41840 Attention and concentration deficit: Secondary | ICD-10-CM | POA: Diagnosis not present

## 2018-07-19 DIAGNOSIS — R0989 Other specified symptoms and signs involving the circulatory and respiratory systems: Secondary | ICD-10-CM | POA: Diagnosis not present

## 2018-07-19 DIAGNOSIS — E039 Hypothyroidism, unspecified: Secondary | ICD-10-CM

## 2018-07-19 DIAGNOSIS — R9431 Abnormal electrocardiogram [ECG] [EKG]: Secondary | ICD-10-CM

## 2018-07-19 DIAGNOSIS — Z5181 Encounter for therapeutic drug level monitoring: Secondary | ICD-10-CM

## 2018-07-19 DIAGNOSIS — M542 Cervicalgia: Secondary | ICD-10-CM

## 2018-07-19 DIAGNOSIS — E66811 Obesity, class 1: Secondary | ICD-10-CM

## 2018-07-19 DIAGNOSIS — F325 Major depressive disorder, single episode, in full remission: Secondary | ICD-10-CM

## 2018-07-19 DIAGNOSIS — E7800 Pure hypercholesterolemia, unspecified: Secondary | ICD-10-CM

## 2018-07-19 DIAGNOSIS — E669 Obesity, unspecified: Secondary | ICD-10-CM

## 2018-07-19 DIAGNOSIS — F32A Depression, unspecified: Secondary | ICD-10-CM

## 2018-07-19 NOTE — Patient Instructions (Addendum)
Someone should be contacting you soon about the referral to [facility]'ll have you see the cardiologist If you have not heard anything from my staff in a week about any orders/referrals/studies from today, please contact us here to follow-up [phone]   Check out the information at familydoctor.org entitled "Nutrition for Weight Loss: What You Need to Know about Fad Diets" Try to lose between 1-2 pounds per week by taking in fewer calories and burning off more calories You can succeed by limiting portions, limiting foods dense in calories and fat, becoming more active, and drinking 8 glasses of water a day (64 ounces) Don't skip meals, especially breakfast, as skipping meals may alter your metabolism Do not use over-the-counter weight loss pills or gimmicks that claim rapid weight loss A healthy BMI (or body mass index) is between 18.5 and 24.9 You can calculate your ideal BMI at the [employer] website ClubMonetize.fr   Preventing Unhealthy Weight Gain, Adult Staying at a healthy weight is important. When fat builds up in your body, you may become overweight or obese. These conditions put you at greater risk for developing certain health problems, such as heart disease, diabetes, sleeping problems, joint problems, and some cancers. Unhealthy weight gain is often the result of making unhealthy choices in what you eat. It is also a result of not getting enough exercise. You can make changes to your lifestyle to prevent obesity and stay as healthy as possible. What nutrition changes can be made? To maintain a healthy weight and prevent obesity:  Eat only as much as your body needs. To do this: ? Pay attention to signs that you are hungry or full. Stop eating as soon as you feel full. ? If you feel hungry, try drinking water first. Drink enough water so your urine is clear or pale yellow. ? Eat smaller portions. ? Look at serving sizes on  food labels. Most foods contain more than one serving per container. ? Eat the recommended amount of calories for your gender and activity level. While most active people should eat around 2,000 calories per day, if you are trying to lose weight or are not very active, you main need to eat less calories. Talk to your health care provider or dietitian about how many calories you should eat each day.  Choose healthy foods, such as: ? Fruits and vegetables. Try to fill at least half of your plate at each meal with fruits and vegetables. ? Whole grains, such as whole wheat bread, brown rice, and quinoa. ? Lean meats, such as chicken or fish. ? Other healthy proteins, such as beans, eggs, or tofu. ? Healthy fats, such as nuts, seeds, fatty fish, and olive oil. ? Low-fat or fat-free dairy.  Check food labels and avoid food and drinks that: ? Are high in calories. ? Have added sugar. ? Are high in sodium. ? Have saturated fats or trans fats.  Limit how much you eat of the following foods: ? Prepackaged meals. ? Fast food. ? Fried foods. ? Processed meat, such as bacon, sausage, and deli meats. ? Fatty cuts of red meat and poultry with skin.  Cook foods in healthier ways, such as by baking, broiling, or grilling.  When grocery shopping, try to shop around the outside of the store. This helps you buy mostly fresh foods and avoid canned and prepackaged foods.  What lifestyle changes can be made?  Exercise at least 30 minutes 5 or more days each week. Exercising includes  brisk walking, yard work, biking, running, swimming, and team sports like basketball and soccer. Ask your health care provider which exercises are safe for you.  Do not use any products that contain nicotine or tobacco, such as cigarettes and e-cigarettes. If you need help quitting, ask your health care provider.  Limit alcohol intake to no more than 1 drink a day for nonpregnant women and 2 drinks a day for men. One drink  equals 12 oz of beer, 5 oz of wine, or 1 oz of hard liquor.  Try to get 7-9 hours of sleep each night. What other changes can be made?  Keep a food and activity journal to keep track of: ? What you ate and how many calories you had. Remember to count sauces, dressings, and side dishes. ? Whether you were active, and what exercises you did. ? Your calorie, weight, and activity goals.  Check your weight regularly. Track any changes. If you notice you have gained weight, make changes to your diet or activity routine.  Avoid taking weight-loss medicines or supplements. Talk to your health care provider before starting any new medicine or supplement.  Talk to your health care provider before trying any new diet or exercise plan. Why are these changes important? Eating healthy, staying active, and having healthy habits not only help prevent obesity, they also:  Help you to manage stress and emotions.  Help you to connect with friends and family.  Improve your self-esteem.  Improve your sleep.  Prevent long-term health problems.  What can happen if changes are not made? Being obese or overweight can cause you to develop joint or bone problems, which can make it hard for you to stay active or do activities you enjoy. Being obese or overweight also puts stress on your heart and lungs and can lead to health problems like diabetes, heart disease, and some cancers. Where to find more information: Talk with your health care provider or a dietitian about healthy eating and healthy lifestyle choices. You may also find other information through these resources:  U.S. Department of Agriculture MyPlate: FormerBoss.no  American Heart Association: www.heart.org  Centers for Disease Control and Prevention: http://www.wolf.info/  Summary  Staying at a healthy weight is important. It helps prevent certain diseases and health problems, such as heart disease, diabetes, joint problems, sleep disorders,  and some cancers.  Being obese or overweight can cause you to develop joint or bone problems, which can make it hard for you to stay active or do activities you enjoy.  You can prevent unhealthy weight gain by eating a healthy diet, exercising regularly, not smoking, limiting alcohol, and getting enough sleep.  Talk with your health care provider or a dietitian for guidance about healthy eating and healthy lifestyle choices. This information is not intended to replace advice given to you by your health care provider. Make sure you discuss any questions you have with your health care provider. Document Released: 07/28/2016 Document Revised: 09/02/2016 Document Reviewed: 09/02/2016 Elsevier Interactive Patient Education  2018 [employer].  Obesity, Adult Obesity is the condition of having too much total body fat. Being overweight or obese means that your weight is greater than what is considered healthy for your body size. Obesity is determined by a measurement called BMI. BMI is an estimate of body fat and is calculated from height and weight. For adults, a BMI of 30 or higher is considered obese. Obesity can eventually lead to other health concerns and major illnesses, including:  Stroke.  Coronary artery disease (CAD).  Type 2 diabetes.  Some types of cancer, including cancers of the colon, breast, uterus, and gallbladder.  Osteoarthritis.  High blood pressure (hypertension).  High cholesterol.  Sleep apnea.  Gallbladder stones.  Infertility problems.  What are the causes? The main cause of obesity is taking in (consuming) more calories than your body uses for energy. Other factors that contribute to this condition may include:  Being born with genes that make you more likely to become obese.  Having a medical condition that causes obesity. These conditions include: ? Hypothyroidism. ? Polycystic ovarian syndrome (PCOS). ? Binge-eating disorder. ? Cushing  syndrome.  Taking certain medicines, such as steroids, antidepressants, and seizure medicines.  Not being physically active (sedentary lifestyle).  Living where there are limited places to exercise safely or buy healthy foods.  Not getting enough sleep.  What increases the risk? The following factors may increase your risk of this condition:  Having a family history of obesity.  Being a woman of African-American descent.  Being a man of Hispanic descent.  What are the signs or symptoms? Having excessive body fat is the main symptom of this condition. How is this diagnosed? This condition may be diagnosed based on:  Your symptoms.  Your medical history.  A physical exam. Your health care provider may measure: ? Your BMI. If you are an adult with a BMI between 25 and less than 30, you are considered overweight. If you are an adult with a BMI of 30 or higher, you are considered obese. ? The distances around your hips and your waist (circumferences). These may be compared to each other to help diagnose your condition. ? Your skinfold thickness. Your health care provider may gently pinch a fold of your skin and measure it.  How is this treated? Treatment for this condition often includes changing your lifestyle. Treatment may include some or all of the following:  Dietary changes. Work with your health care provider and a dietitian to set a weight-loss goal that is healthy and reasonable for you. Dietary changes may include eating: ? Smaller portions. A portion size is the amount of a particular food that is healthy for you to eat at one time. This varies from person to person. ? Low-calorie or low-fat options. ? More whole grains, fruits, and vegetables.  Regular physical activity. This may include aerobic activity (cardio) and strength training.  Medicine to help you lose weight. Your health care provider may prescribe medicine if you are unable to lose 1 pound a week after 6  weeks of eating more healthily and doing more physical activity.  Surgery. Surgical options may include gastric banding and gastric bypass. Surgery may be done if: ? Other treatments have not helped to improve your condition. ? You have a BMI of 40 or higher. ? You have life-threatening health problems related to obesity.  Follow these instructions at home:  Eating and drinking   Follow recommendations from your health care provider about what you eat and drink. Your health care provider may advise you to: ? Limit fast foods, sweets, and processed snack foods. ? Choose low-fat options, such as low-fat milk instead of whole milk. ? Eat 5 or more servings of fruits or vegetables every day. ? Eat at home more often. This gives you more control over what you eat. ? Choose healthy foods when you eat out. ? Learn what a healthy portion size is. ? Keep low-fat snacks on hand. ? Avoid sugary  drinks, such as soda, fruit juice, iced tea sweetened with sugar, and flavored milk. ? Eat a healthy breakfast.  Drink enough water to keep your urine clear or pale yellow.  Do not go without eating for long periods of time (do not fast) or follow a fad diet. Fasting and fad diets can be unhealthy and even dangerous. Physical Activity  Exercise regularly, as told by your health care provider. Ask your health care provider what types of exercise are safe for you and how often you should exercise.  Warm up and stretch before being active.  Cool down and stretch after being active.  Rest between periods of activity. Lifestyle  Limit the time that you spend in front of your TV, computer, or video game system.  Find ways to reward yourself that do not involve food.  Limit alcohol intake to no more than 1 drink a day for nonpregnant women and 2 drinks a day for men. One drink equals 12 oz of beer, 5 oz of wine, or 1 oz of hard liquor. General instructions  Keep a weight loss journal to keep track  of the food you eat and how much you exercise you get.  Take over-the-counter and prescription medicines only as told by your health care provider.  Take vitamins and supplements only as told by your health care provider.  Consider joining a support group. Your health care provider may be able to recommend a support group.  Keep all follow-up visits as told by your health care provider. This is important. Contact a health care provider if:  You are unable to meet your weight loss goal after 6 weeks of dietary and lifestyle changes. This information is not intended to replace advice given to you by your health care provider. Make sure you discuss any questions you have with your health care provider. Document Released: 09/03/2004 Document Revised: 12/30/2015 Document Reviewed: 05/15/2015 Elsevier Interactive Patient Education  2018 [employer].

## 2018-07-19 NOTE — Progress Notes (Signed)
BP 126/80   Pulse 82   Temp 98.1 F (36.7 C) (Oral)   Ht 5\' 8"  (1.727 m)   Wt 207 lb (93.9 kg)   SpO2 96%   BMI 31.47 kg/m    Subjective:    Patient ID: Jennifer Whitney, female    DOB: [birth year], 57 y.o.   MRN: [id number]  HPI: Jennifer Whitney is a 57 y.o. female  Chief Complaint  Patient presents with  . Follow-up    dx with ADHD  . Neck Pain    onset 1 month    HPI Patient is here for f/u; she established care at the last visit in June; she wanted to be tested for ADHD; taking citalopram for depression; she went to the [facility]; she diagnosed her with adult ADHD; they did not start her on any medicine; she is interested in starting medicine; nephew had Tourette's syndrome and possible takes ADHD and he was on medicine she thinks but doesn't know what he takes Drinks caffeine, [employer] and Diet Coke and coffee and tea; she drinks two caffeinated drinks in a day; no energy drinks No hx of heart problems personally but positive in the family Father died from either a stroke or heart attack; he died at home at age 31; heavy drinker until 70 years before he died; chain smoker Mother had triple bypass surgery, then heart failure later in life possibly related to lung cancer Daughter has SVT Sister had HTN and murmur  Hypothyroidism; last lab normal  Lab Results  Component Value Date   TSH 3.80 01/31/2018   Hx of anemia; last CBC was normal  She has neck pain; going on for one month; over on the left side, anterior aspect; it feels weird; hurts when neck flexed and has her chin down like when she is reading; she has a sore spot on her spine; her brother has been diagnosed with something with his spine; some arthritis autoimmune disease; brother has ankylosing spondylitis; patient will try to get the exact name She is not aware of anyone with head/neck cancer; no night sweats; no weight loss; she can feel a tender area back on the left tonsil; sounds  like ears are stopped up and ringing; both ears  Obesity; down over 2.5 pounds over the holidays; she had been on prednisone earlier by Dr. Mack Guise  High cholesterol; eating cheese and bacon; reviewed last cholesterol panel; total 210, HDL 36, TG 184, LDL 142 Teaches PE classes, active; no chest pain whatsoever  Depression screen [facility] 2/9 07/19/2018 01/31/2018  Decreased Interest 0 0  Down, Depressed, Hopeless 0 0  PHQ - 2 Score 0 0  Altered sleeping 0 -  Tired, decreased energy 1 -  Change in appetite 0 -  Feeling bad or failure about yourself  0 -  Trouble concentrating 0 -  Moving slowly or fidgety/restless 0 -  Suicidal thoughts 0 -  PHQ-9 Score 1 -  Difficult doing work/chores Not difficult at all -   Fall Risk  07/19/2018 01/31/2018  Falls in the past year? 0 No    Relevant past medical, surgical, family and social history reviewed Past Medical History:  Diagnosis Date  . Acquired hypothyroidism 11/25/2015  . ADHD   . Allergic rhinitis 06/14/2017  . Arthritis   . 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 Chronic back pain 06/28/2017  . Depression 11/25/2015  . 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 High cholesterol 06/17/2015   Overview:  The 10-year  ASCVD risk score Mikey Bussing DC Jr., et al., 2013) is: 2.4%-2016  . Menopausal syndrome (hot flashes) 06/14/2017  . Pelvic pressure in female 06/28/2017  . Pneumonia   . Thyroid disease   . Urinary incontinence in female 06/14/2017  . 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 ABDOMINAL HYSTERECTOMY    . Bladder Mesh  2014  . COLONOSCOPY WITH PROPOFOL N/A 04/22/2018   Procedure: COLONOSCOPY WITH PROPOFOL;  Surgeon: Lucilla Lame, MD;  Location: [facility];  Service: Endoscopy;  Laterality: N/A;  . POLYPECTOMY N/A 04/22/2018   Procedure: POLYPECTOMY;  Surgeon: Lucilla Lame, MD;  Location: [facility];  Service: Endoscopy;  Laterality: N/A;  . TUBAL LIGATION     Family  History  Problem Relation Age of Onset  . Heart failure Mother   . Lung cancer Mother   . Heart disease Mother   . Stroke Father   . Heart attack Father   . Hypertension Sister   . Hypertension Brother   . Arrhythmia Daughter        SVT  . Heart disease Paternal Grandmother   . Emphysema Paternal Grandfather   . Hypertension Sister   . Prostate cancer Neg Hx   . Kidney cancer Neg Hx   . Breast cancer Neg Hx    Social History   Tobacco Use  . Smoking status: Former Research scientist (life sciences)  . Smokeless tobacco: Never Used  Substance Use Topics  . Alcohol use: No  . Drug use: No     Office Visit from 07/19/2018 in [facility]  AUDIT-C Score  0      Interim medical history since last visit reviewed. Allergies and medications reviewed  Review of Systems Per HPI unless specifically indicated above     Objective:    BP 126/80   Pulse 82   Temp 98.1 F (36.7 C) (Oral)   Ht 5\' 8"  (1.727 m)   Wt 207 lb (93.9 kg)   SpO2 96%   BMI 31.47 kg/m   Wt Readings from Last 3 Encounters:  07/19/18 207 lb (93.9 kg)  06/15/18 209 lb 11.2 oz (95.1 kg)  04/22/18 204 lb (92.5 kg)    Physical Exam  Constitutional: She appears well-developed and well-nourished.  HENT:  Mouth/Throat: Mucous membranes are normal.  Small concretion (?) LEFT tonsil  Eyes: EOM are normal. No scleral icterus.  Neck: Spinous process tenderness present.    Tender over the C7, T1 region with mild swelling  Cardiovascular: Normal rate and regular rhythm.  Pulmonary/Chest: Effort normal and breath sounds normal.  Lymphadenopathy:    She has no cervical adenopathy.  Psychiatric: She has a normal mood and affect. Her behavior is normal.    Results for orders placed or performed in visit on 06/15/18  CULTURE, URINE COMPREHENSIVE  Result Value Ref Range   Urine Culture, Comprehensive Final report    Organism ID, Bacteria Comment   Microscopic Examination  Result Value Ref Range   WBC, UA None  seen 0 - 5 /hpf   RBC, UA 0-2 0 - 2 /hpf   Epithelial Cells (non renal) 0-10 0 - 10 /hpf   Renal Epithel, UA 0-10 (A) None seen /hpf   Bacteria, UA Few (A) None seen/Few  Urinalysis, Complete  Result Value Ref Range   Specific Gravity, UA 1.010 1.005 - 1.030   pH, UA 6.0 5.0 - 7.5   Color, UA Yellow Yellow   Appearance Ur Clear  Clear   Leukocytes, UA Trace (A) Negative   Protein, UA Negative Negative/Trace   Glucose, UA Negative Negative   Ketones, UA Negative Negative   RBC, UA Negative Negative   Bilirubin, UA Negative Negative   Urobilinogen, Ur 0.2 0.2 - 1.0 mg/dL   Nitrite, UA Negative Negative   Microscopic Examination See below:       Assessment & Plan:   Problem List Items Addressed This Visit      Endocrine   Acquired hypothyroidism (Chronic)    Last TSH normal; would want to make sure they I'm not starting a stimulant on top of a suppressed TSH        Other   Depression, major, single episode, complete remission (HCC)   Depression (Chronic)   Obesity (BMI 30.0-34.9)    encourgement given for weight loss      High cholesterol    Reviewed 10 year ASCVD risk with her; explained does not include family hx factored in to calculation; will refer to cardiologist for evaluation PRIOR to starting a stimulant; encouraged healthy eating       Other Visit Diagnoses    Attention and concentration deficit    -  Primary   awaiting report from ADHD testing to see if this is truly ADHD; discussed stimulants, cardiac risk, refer to cards after abnormal EKG today   Relevant Orders   EKG 12-Lead   Urine Drug Screen w/Alc, no confirm   Abnormal EKG       nonspecific T wave changes in V1 and V2; trivial insignificant Q waves inferiorly (less than 1/3); refer to cards to see if appropriate to start stimulant   Relevant Orders   Ambulatory referral to Cardiology   Tonsil pain       refer to ENT   Relevant Orders   Ambulatory referral to ENT   Anterior neck pain        refer to ENT   Relevant Orders   Ambulatory referral to ENT   Ringing in ear, bilateral       refer to ENT   Relevant Orders   Ambulatory referral to ENT   Medication monitoring encounter       Relevant Orders   EKG 12-Lead   Urine Drug Screen w/Alc, no confirm   Cervical pain       encouraged patient to see her orthopaedist for this, and to check with her brother to find out if he truly has a degenerative spine disorder, let ortho know       Follow up plan: Return in about 4 weeks (around 08/16/2018).  An after-visit summary was printed and given to the patient at [facility].  Please see the patient instructions which may contain other information and recommendations beyond what is mentioned above in the assessment and plan.  No orders of the defined types were placed in this encounter.   Orders Placed This Encounter  Procedures  . Urine Drug Screen w/Alc, no confirm  . Ambulatory referral to ENT  . Ambulatory referral to Cardiology  . EKG 12-Lead

## 2018-07-19 NOTE — Assessment & Plan Note (Signed)
encourgement given for weight loss

## 2018-07-19 NOTE — Assessment & Plan Note (Signed)
Reviewed 10 year ASCVD risk with her; explained does not include family hx factored in to calculation; will refer to cardiologist for evaluation PRIOR to starting a stimulant; encouraged healthy eating

## 2018-07-19 NOTE — Assessment & Plan Note (Signed)
Last TSH normal; would want to make sure they I'm not starting a stimulant on top of a suppressed TSH

## 2018-07-20 ENCOUNTER — Telehealth: Payer: Self-pay | Admitting: Family Medicine

## 2018-07-20 ENCOUNTER — Encounter: Payer: Self-pay | Admitting: Family Medicine

## 2018-07-20 DIAGNOSIS — F988 Other specified behavioral and emotional disorders with onset usually occurring in childhood and adolescence: Secondary | ICD-10-CM | POA: Insufficient documentation

## 2018-07-20 DIAGNOSIS — F909 Attention-deficit hyperactivity disorder, unspecified type: Secondary | ICD-10-CM | POA: Insufficient documentation

## 2018-07-20 DIAGNOSIS — F902 Attention-deficit hyperactivity disorder, combined type: Secondary | ICD-10-CM

## 2018-07-20 HISTORY — DX: Attention-deficit hyperactivity disorder, combined type: F90.2

## 2018-07-20 NOTE — Telephone Encounter (Signed)
Pt.notified

## 2018-07-20 NOTE — Telephone Encounter (Signed)
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 Glennon Hamilton, [place], [phone]

## 2018-07-21 LAB — DRUG SCREEN URINE W/ALC, NO CONF
ALCOHOL, ETHYL (U): NEGATIVE
AMPHETAMINES (1000 NG/ML SCRN): NEGATIVE
BARBITURATES: NEGATIVE
BENZODIAZEPINES: NEGATIVE
COCAINE METABOLITES: NEGATIVE
MARIJUANA MET (50 ng/mL SCRN): NEGATIVE
METHADONE: NEGATIVE
METHAQUALONE: NEGATIVE
OPIATES: NEGATIVE
PHENCYCLIDINE: NEGATIVE
PROPOXYPHENE: NEGATIVE

## 2018-08-02 ENCOUNTER — Encounter: Payer: Self-pay | Admitting: Cardiovascular Disease

## 2018-08-02 ENCOUNTER — Ambulatory Visit: Payer: BLUE CROSS/BLUE SHIELD | Admitting: Cardiovascular Disease

## 2018-08-02 ENCOUNTER — Telehealth: Payer: Self-pay | Admitting: Family Medicine

## 2018-08-02 VITALS — BP 132/72 | HR 65 | Ht 69.0 in | Wt 207.0 lb

## 2018-08-02 DIAGNOSIS — F418 Other specified anxiety disorders: Secondary | ICD-10-CM

## 2018-08-02 DIAGNOSIS — R9431 Abnormal electrocardiogram [ECG] [EKG]: Secondary | ICD-10-CM

## 2018-08-02 DIAGNOSIS — Z8249 Family history of ischemic heart disease and other diseases of the circulatory system: Secondary | ICD-10-CM | POA: Diagnosis not present

## 2018-08-02 DIAGNOSIS — E782 Mixed hyperlipidemia: Secondary | ICD-10-CM | POA: Diagnosis not present

## 2018-08-02 DIAGNOSIS — F909 Attention-deficit hyperactivity disorder, unspecified type: Secondary | ICD-10-CM | POA: Diagnosis not present

## 2018-08-02 MED ORDER — AMPHETAMINE-DEXTROAMPHET ER 10 MG PO CP24: 10.0000 mg | ORAL_CAPSULE | Freq: Every day | ORAL | 0 refills | Status: DC

## 2018-08-02 NOTE — Progress Notes (Signed)
Cardiology Office Note  Date:  08/02/2018   ID:  Jennifer Whitney, DOB September 23, 1960, MRN [id number]  PCP:  Arnetha Courser, MD   Chief Complaint  Patient presents with  . New Patient (Initial Visit)    abnormal EKG. Medications reviewed verbally.     HPI:  Jennifer Whitney is a 57 year old woman with past medical history of Anxiety/depression ADHD Hyperlipidemia Family history of coronary artery disease Who presents by referral from Dr. 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 Limited by some arthritis, chronic knee pain  Denies any chest pain or shortness of breath on exertion Was told her EKG was abnormal  EKG personally reviewed by myself on todays visit Shows normal sinus rhythm with rate 65 bpm no significant ST or T wave changes EKG through primary care showing T wave inversion V3 (likely secondary to lead placement)  Family history discussed with her in detail Grandmother had CAD, her husband smoker, passed away in the 24 Mother:CAD, CABG, smoker, lung ca Father: smoker,    PMH:   has a past medical history of Acquired hypothyroidism (11/25/2015), ADHD, ADHD (attention deficit hyperactivity disorder), combined type (07/20/2018), Allergic rhinitis (06/14/2017), Arthritis, 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  PSH:    Past Surgical History:  Procedure Laterality Date  . ABDOMINAL  HYSTERECTOMY    . Bladder Mesh  2014  . COLONOSCOPY WITH PROPOFOL N/A 04/22/2018   Procedure: COLONOSCOPY WITH PROPOFOL;  Surgeon: Lucilla Lame, MD;  Location: [facility];  Service: Endoscopy;  Laterality: N/A;  . POLYPECTOMY N/A 04/22/2018   Procedure: POLYPECTOMY;  Surgeon: Lucilla Lame, MD;  Location: [facility];  Service: Endoscopy;  Laterality: N/A;  . TUBAL LIGATION      Current Outpatient Medications  Medication Sig Dispense Refill  . citalopram (CELEXA) 20 MG tablet Take 1 tablet (20 mg total) by mouth daily. 90 tablet 1  . fluticasone (FLONASE) 50 MCG/ACT nasal spray Place 2 sprays into the nose as needed.    Marland Kitchen levothyroxine (SYNTHROID, LEVOTHROID) 25 MCG tablet Take 25 mcg by mouth daily before breakfast.    . loratadine (CLARITIN) 10 MG tablet Take 10 mg by mouth daily.     No current facility-administered medications for this visit.     Allergies:   Patient has no known allergies.   Social History:  The patient  reports that she has quit smoking. She has never used smokeless tobacco. She reports that she does not drink alcohol or use drugs.   Family History:   family history includes Arrhythmia in her daughter; Emphysema in her paternal grandfather; Heart attack in her father; Heart disease in her mother and paternal grandmother; Heart failure in her mother; Hypertension in her brother, sister, and sister; Lung cancer in her mother; Stroke in her father.    Review of Systems: Review of Systems  Constitutional: Negative.   Respiratory: Negative.   Cardiovascular: Negative.   Gastrointestinal: Negative.   Musculoskeletal: Positive for joint pain.  Neurological: Negative.   Psychiatric/Behavioral: Negative.   All other systems  reviewed and are negative.    PHYSICAL EXAM: VS:  BP 132/72 (BP Location: Left Arm, Patient Position: Sitting, Cuff Size: Normal)   Pulse 65   Ht 5\' 9"  (1.753 m)   Wt 207 lb (93.9 kg)   BMI 30.57 kg/m  , BMI Body mass index  is 30.57 kg/m. GEN: Well nourished, well developed, in no acute distress  HEENT: normal  Neck: no JVD, carotid bruits, or masses Cardiac: RRR; no murmurs, rubs, or gallops,no edema  Respiratory:  clear to auscultation bilaterally, normal work of breathing GI: soft, nontender, nondistended, + BS MS: no deformity or atrophy  Skin: warm and dry, no rash Neuro:  Strength and sensation are intact Psych: euthymic mood, full affect   Recent Labs: 01/31/2018: ALT 40; BUN 14; Creat 0.96; Potassium 4.2; Sodium 140; TSH 3.80 05/11/2018: Hemoglobin 13.2; Magnesium 2.1; Platelets 232    Lipid Panel Lab Results  Component Value Date   CHOL 210 (H) 01/31/2018   HDL 36 (L) 01/31/2018   LDLCALC 142 (H) 01/31/2018   TRIG 184 (H) 01/31/2018      Wt Readings from Last 3 Encounters:  08/02/18 207 lb (93.9 kg)  07/19/18 207 lb (93.9 kg)  06/15/18 209 lb 11.2 oz (95.1 kg)      ASSESSMENT AND PLAN:  Abnormal EKG Normal EKG on today's visit  T wave inversion V3 at primary care office most likely secondary to lead placement No symptoms concerning for angina Non-smoker, no diabetes No further testing needed at this time  family history of heart disease Most of her relatives were longtime smokers We did discuss screening studies such as CT coronary calcium scoring This is not urgent.  She will call us if she would like to have this done in the future  Mixed hyperlipidemia We did discuss lipid management No documented coronary disease or PAD Could consider screening studies such as calcium scoring if she would like in the future Would not compare her to other relatives given their longtime history of smoking  Attention deficit hyperactivity disorder (ADHD), unspecified ADHD type Acceptable risk to take medication, no further cardiac testing needed  Anxiety with depression On SSRI  Disposition:   F/U as needed   Total encounter time more than 60 minutes  Greater than 50% was spent in  counseling and coordination of care with the patient  Patient was seen in consultation for Dr. Sanda Klein will be referred back to her office for ongoing care of the issues detailed above   No orders of the defined types were placed in this encounter.    Signed, Esmond Plants, M.D., Ph.D. 08/02/2018  [place], [place]

## 2018-08-02 NOTE — Patient Instructions (Signed)

## 2018-08-02 NOTE — Telephone Encounter (Signed)
I appreciate the cardiologist seeing patient and clearing her for stimulant use I called patient and she is amenable to starting medicine (probably after Christmas) Able to check pulse and BP at school, someone can do that, 5 days after starting medicine have that checked and give [place] a call Call with vital signs and response in 5-7days and we'll go from there as to whether we increase, decrease, or continue

## 2018-08-11 ENCOUNTER — Other Ambulatory Visit: Payer: Self-pay | Admitting: Family Medicine

## 2018-08-11 NOTE — Telephone Encounter (Signed)
Left detailed voicemail that it looks like she is getting rx elsewhere, called for clarification?

## 2018-08-11 NOTE — Telephone Encounter (Signed)
Copied from [place] [id number]. Topic: Quick Communication - Rx Refill/Question >> Aug 11, 2018 12:35 PM Selinda Flavin B, NT wrote: Medication: levothyroxine (SYNTHROID, LEVOTHROID) 75 MCG tablet   Has the patient contacted their pharmacy? Yes.  To call office since the prescription is out of date (Agent: If no, request that the patient contact the pharmacy for the refill.) (Agent: If yes, when and what did the pharmacy advise?)  Preferred Pharmacy (with phone number or street name): [facility] [place]: Please be advised that RX refills may take up to 3 business days. We ask that you follow-up with your pharmacy.

## 2018-08-12 MED ORDER — LEVOTHYROXINE SODIUM 25 MCG PO TABS: 25.0000 ug | ORAL_TABLET | Freq: Every day | ORAL | 1 refills | Status: DC

## 2018-08-12 NOTE — Telephone Encounter (Signed)
Patients states that you all talked and you would take over

## 2018-08-12 NOTE — Telephone Encounter (Signed)
Lab Results  Component Value Date   TSH 3.80 01/31/2018   Rx approved

## 2018-08-15 ENCOUNTER — Ambulatory Visit: Payer: BLUE CROSS/BLUE SHIELD | Admitting: Family Medicine

## 2018-08-22 ENCOUNTER — Other Ambulatory Visit: Payer: Self-pay

## 2018-08-22 MED ORDER — LEVOTHYROXINE SODIUM 75 MCG PO TABS: 75.0000 ug | ORAL_TABLET | Freq: Every day | ORAL | 4 refills | Status: DC

## 2018-08-22 NOTE — Telephone Encounter (Signed)
Dominica Severin calling from [employer] stated that the dosage has changed and would like to confirm change. Please advise

## 2018-08-22 NOTE — Telephone Encounter (Signed)
Calle spoke with patient her current dosage is 75mg , I will update in chart

## 2018-08-22 NOTE — Telephone Encounter (Signed)
She does not have anything in her list that is a 75 mg pill  There is no drug name associated with the phone note  Please for the sake of thoroughness confirm drug and dose please and what I'm supposed to do with this information  Thank you

## 2018-08-22 NOTE — Telephone Encounter (Signed)
Copied from [place] [id number] [id number]. Topic: Conservator, museum/gallery Patient (Clinic Use ONLY) >> Aug 22, 2018  4:18 PM Cathrine Muster, CMA wrote: Reason for CRM: regarding med dosage >> Aug 22, 2018  4:23 PM Windy Kalata wrote: Husband called back and states that it is 75mg 

## 2018-08-22 NOTE — Addendum Note (Signed)
Addended by: Docia Furl on: 08/22/2018 04:58 PM   Modules accepted: Orders

## 2018-08-22 NOTE — Addendum Note (Signed)
Addended by: Brinlynn Gorton, Satira Anis on: 08/22/2018 05:11 PM   Modules accepted: Orders

## 2018-08-22 NOTE — Telephone Encounter (Signed)
Lab Results  Component Value Date   TSH 3.80 01/31/2018   This is a 75 mcg pill, not 75 mg Last thyroid lab reviewed Rx approved

## 2018-08-22 NOTE — Telephone Encounter (Signed)
Called patient to try to verify dosage, husband will have her call back

## 2018-08-22 NOTE — Telephone Encounter (Signed)
Dominica Severin with [facility] is calling back to verify which dosage. He is aware we are waiting on a call back from the husband

## 2018-08-23 NOTE — Addendum Note (Signed)
Addended by: Elyza Whitt, Satira Anis on: 08/23/2018 10:31 AM   Modules accepted: Orders

## 2018-08-23 NOTE — Addendum Note (Signed)
Addended by: Docia Furl on: 08/23/2018 08:24 AM   Modules accepted: Orders

## 2018-09-14 ENCOUNTER — Ambulatory Visit: Payer: BLUE CROSS/BLUE SHIELD | Admitting: Family Medicine

## 2018-09-14 ENCOUNTER — Encounter: Payer: Self-pay | Admitting: Family Medicine

## 2018-09-14 VITALS — BP 118/70 | HR 86 | Temp 97.9°F | Resp 12 | Ht 69.0 in | Wt 210.2 lb

## 2018-09-14 DIAGNOSIS — E669 Obesity, unspecified: Secondary | ICD-10-CM

## 2018-09-14 DIAGNOSIS — E039 Hypothyroidism, unspecified: Secondary | ICD-10-CM

## 2018-09-14 DIAGNOSIS — F909 Attention-deficit hyperactivity disorder, unspecified type: Secondary | ICD-10-CM

## 2018-09-14 DIAGNOSIS — Z5181 Encounter for therapeutic drug level monitoring: Secondary | ICD-10-CM | POA: Diagnosis not present

## 2018-09-14 DIAGNOSIS — E782 Mixed hyperlipidemia: Secondary | ICD-10-CM

## 2018-09-14 DIAGNOSIS — L299 Pruritus, unspecified: Secondary | ICD-10-CM

## 2018-09-14 MED ORDER — AMPHETAMINE-DEXTROAMPHET ER 15 MG PO CP24: 15.0000 mg | ORAL_CAPSULE | ORAL | 0 refills | Status: DC

## 2018-09-14 NOTE — Assessment & Plan Note (Signed)
Patient exhibited ADHD type thought processes today during history, changed subjects, but not tangential or psychotic; will increase medicine, close f/u with CMA for BP and pulse and weight; will continue to increase in small increments until appropriate response without side effects

## 2018-09-14 NOTE — Patient Instructions (Addendum)
Try turmeric as a natural anti-inflammatory (for pain and arthritis). It comes in capsules where you buy aspirin and fish oil, but also as a spice where you buy pepper and garlic powder. Please do call your foot or ankle specialist about your concerns Increase the ADHD medicine to 15 mg daily Return next week for pulse and BP check with the CMA We'll check labs today If you have not heard anything from my staff in a week about any orders/referrals/studies from today, please contact us here to follow-up [phone]

## 2018-09-14 NOTE — Assessment & Plan Note (Signed)
NOTE: ADHD medicine is NOT for weight loss

## 2018-09-14 NOTE — Assessment & Plan Note (Signed)
Check lipids, limit saturated fats 

## 2018-09-14 NOTE — Assessment & Plan Note (Addendum)
Check TSH and free T4; patient would like to see if dose appropriate; discussed that rechecking labs is certainly appropriate 6 weeks after any change, different dose or manufacturer, etc

## 2018-09-14 NOTE — Progress Notes (Signed)
BP 118/70   Pulse 86   Temp 97.9 F (36.6 C) (Oral)   Resp 12   Ht _0  (1.753 m)   Wt 210 lb 3.2 oz (95.3 kg)   SpO2 95%   BMI 31.04 kg/m    Subjective:    Patient ID: Jennifer Whitney, female    DOB: April 26, 1961, 58 y.o.   MRN: [id number]  HPI: MARKESIA CRILLY is a 58 y.o. female  Chief Complaint  Patient presents with  . Follow-up  . Plantar Fasciitis    feels like she cant walk  . 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, does not run b/c of arthritis in the knee and the plantar issues (see below) She does not have much will power, she mentioned in passing when we we talking about obesity; the ADHD medicine is NOT for weight loss  She was on synthroid for 75 mcg; that was refilled in January Having hot flashes, heat intolerant; weight gain; no hair loss; skin is dry and itchy on her arms; she thinks she is getting a headache at times from generic version; this happened before when they switched her to a generic; no vertigo; before, if she missed it, then she tries to take it in the morning, sometimes she forgets  Lab Results  Component Value Date   TSH 3.80 01/31/2018   She thinks she has plantar fasciitis; went to ortho back in September; she took medicine for pneumonia in April 2019; she was not trying to be paranoid; she was water skiing and had fallen, when the skies popped off, felt electrical feeling, like it hit a nerve or something; she jumped through these topics almost verbatim; the ortho  did xrays and hoped it was soreness and it would go away; had plantar fasciitis and had a shot in the left foot; has orthopaedic foot inserts and still kills my feet she says; arch supports; when she gets home from work, when she gets up from sitting, feels like she can't walk, or she has to walk on the outside of her feet so it won't hurt; ortho did not think she ruptured any tendons, but that was a possible side effect of the medicine; hurts in her legs; she may have to change to [facility] provider; goes to ortho for ankles and foot doctor for plantar fasciitis  High cholesterol; has not been watching her cholesterol; craving eating, citalopram makes her hungry; "stress eating", "I don't know what you call it"; "I eat lots of food"; doesn't eat out a lot; does snack  No alcohol  Depression screen [facility] 2/9 09/14/2018 07/19/2018 01/31/2018  Decreased Interest 0 0 0  Down, Depressed, Hopeless 0 0 0  PHQ - 2 Score 0 0 0  Altered sleeping 0 0 -  Tired, decreased energy 0 1 -  Change in appetite 0 0 -  Feeling bad or failure about yourself  0 0 -  Trouble concentrating 0 0 -  Moving  slowly or fidgety/restless 0 0 -  Suicidal thoughts 0 0 -  PHQ-9 Score 0 1 -  Difficult doing work/chores Not difficult at all Not difficult at all -   Fall Risk  09/14/2018 07/19/2018 01/31/2018  Falls in the past year? 0 0 No  Number falls in past yr: 0 - -  Injury with Fall? 0 - -    Relevant past medical, surgical, family and social history reviewed Past Medical History:  Diagnosis Date  . Acquired hypothyroidism 11/25/2015  . ADHD   . ADHD (attention deficit hyperactivity disorder), combined type 07/20/2018   Evaluation by Dr. Glennon Hamilton, 2019  . Allergic rhinitis 06/14/2017  . Arthritis   . 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 Chronic back pain 06/28/2017  . Depression 11/25/2015  . 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 High cholesterol 06/17/2015   Overview:  The 10-year ASCVD risk score Mikey Bussing DC Jr., et al., 2013) is:  2.4%-2016  . Menopausal syndrome (hot flashes) 06/14/2017  . Pelvic pressure in female 06/28/2017  . Pneumonia   . Thyroid disease   . Urinary incontinence in female 06/14/2017  . 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 ABDOMINAL HYSTERECTOMY    . Bladder Mesh  2014  . COLONOSCOPY WITH PROPOFOL N/A 04/22/2018   Procedure: COLONOSCOPY WITH PROPOFOL;  Surgeon: Lucilla Lame, MD;  Location: [facility];  Service: Endoscopy;  Laterality: N/A;  . POLYPECTOMY N/A 04/22/2018   Procedure: POLYPECTOMY;  Surgeon: Lucilla Lame, MD;  Location: [facility];  Service: Endoscopy;  Laterality: N/A;  . TUBAL LIGATION     Family History  Problem Relation Age of Onset  . Heart failure Mother   . Lung cancer Mother   . Heart disease Mother   . Stroke Father   . Heart attack Father   . Hypertension Sister   . Hypertension Brother   . Arrhythmia Daughter        SVT  . Heart disease Paternal Grandmother   . Emphysema Paternal Grandfather   . Hypertension Sister   . Prostate cancer Neg Hx   . Kidney cancer Neg Hx   . Breast cancer Neg Hx    Social History   Tobacco Use  . Smoking status: Former Research scientist (life sciences)  . Smokeless tobacco: Never Used  Substance Use Topics  . Alcohol use: No  . Drug use: No     [facility] from [facility]/14/2018 [facility]  AUDIT-C Score  0      Interim medical history since last visit reviewed. Allergies and medications reviewed  Review of Systems Per HPI unless specifically indicated above     Objective:    BP 118/70   Pulse 86   Temp 97.9 F (36.6 C) (Oral)   Resp 12   Ht _0  (1.753 m)   Wt 210 lb 3.2 oz (95.3 kg)   SpO2 95%   BMI 31.04 kg/m   Wt Readings from Last 3 Encounters:  09/14/18 210 lb 3.2 oz (95.3 kg)  08/02/18 207 lb (93.9 kg)  07/19/18 207 lb (93.9 kg)    Physical Exam Constitutional:      Appearance: She is well-developed.  Cardiovascular:     Rate and  Rhythm: Normal rate and regular rhythm.  No extrasystoles are present. Pulmonary:     Effort: Pulmonary effort is normal.     Breath sounds: Normal breath sounds.  Skin:    Findings: No rash.  Comments: No visible rash on the right arm, no excoriations or lichenification  Neurological:     Motor: No tremor.     Deep Tendon Reflexes:     Reflex Scores:      Patellar reflexes are 2+ on the right side and 2+ on the left side. Psychiatric:        Mood and Affect: Mood is not anxious. Affect is not inappropriate.        Speech: Speech is not rapid and pressured or tangential.        Behavior: Behavior normal.        Thought Content: Thought content normal.        Judgment: Judgment normal.     Comments: Switched topics, some difficulty keeping on task, problem based, but redirectable and not tangential or hyperactive per se; good eye contact with examiner     Results for orders placed or performed in visit on 07/19/18  Urine Drug Screen w/Alc, no confirm  Result Value Ref Range   Please note     AMPHETAMINES (1000 ng/mL SCRN) NEGATIVE    BARBITURATES NEGATIVE    BENZODIAZEPINES NEGATIVE    COCAINE METABOLITES NEGATIVE    MARIJUANA MET (50 ng/mL SCRN) NEGATIVE    METHADONE NEGATIVE    METHAQUALONE NEGATIVE    OPIATES NEGATIVE    PHENCYCLIDINE NEGATIVE    PROPOXYPHENE NEGATIVE    ALCOHOL, ETHYL (U) NEGATIVE       Assessment & Plan:   Problem List Items Addressed This Visit      Endocrine   Acquired hypothyroidism - Primary (Chronic)    Check TSH and free T4; patient would like to see if dose appropriate; discussed that rechecking labs is certainly appropriate 6 weeks after any change, different dose or manufacturer, etc      Relevant Orders   T4, free   TSH     Other   Obesity (BMI 30.0-34.9)    NOTE: ADHD medicine is NOT for weight loss      Mixed hyperlipidemia    Check lipids, limit saturated fats      Relevant Orders   Lipid panel   Attention deficit  hyperactivity disorder (ADHD)    Patient exhibited ADHD type thought processes today during history, changed subjects, but not tangential or psychotic; will increase medicine, close f/u with CMA for BP and pulse and weight; will continue to increase in small increments until appropriate response without side effects       Other Visit Diagnoses    Pruritus       check TSH and free T4; no visible rash   Medication monitoring encounter       Relevant Orders   COMPLETE METABOLIC PANEL WITH GFR       Follow up plan: Return in about 8 days (around 09/22/2018) for blood pressure and pulse recheck with CMA; 2 months with Dr. Sanda Klein.  An after-visit summary was printed and given to the patient at [facility].  Please see the patient instructions which may contain other information and recommendations beyond what is mentioned above in the assessment and plan.  Meds ordered this encounter  Medications  . amphetamine-dextroamphetamine (ADDERALL XR) 15 MG 24 hr capsule    Sig: Take 1 capsule by mouth every morning.    Dispense:  9 capsule    Refill:  0    Orders Placed This Encounter  Procedures  . T4, free  . TSH  . COMPLETE METABOLIC PANEL WITH GFR  . Lipid  panel

## 2018-09-15 LAB — COMPLETE METABOLIC PANEL WITH GFR
AG Ratio: 1.5 (calc) (ref 1.0–2.5)
ALBUMIN MSPROF: 3.8 g/dL (ref 3.6–5.1)
ALT: 27 U/L (ref 6–29)
AST: 21 U/L (ref 10–35)
Alkaline phosphatase (APISO): 100 U/L (ref 37–153)
BILIRUBIN TOTAL: 0.3 mg/dL (ref 0.2–1.2)
BUN: 14 mg/dL (ref 7–25)
CHLORIDE: 106 mmol/L (ref 98–110)
CO2: 29 mmol/L (ref 20–32)
CREATININE: 0.96 mg/dL (ref 0.50–1.05)
Calcium: 9.1 mg/dL (ref 8.6–10.4)
GFR, EST AFRICAN AMERICAN: 77 mL/min/{1.73_m2} (ref >=60)
GFR, Est Non African American: 66 mL/min/{1.73_m2} (ref >=60)
GLUCOSE: 90 mg/dL (ref 65–99)
Globulin: 2.6 g/dL (calc) (ref 1.9–3.7)
Potassium: 4 mmol/L (ref 3.5–5.3)
Sodium: 142 mmol/L (ref 135–146)
TOTAL PROTEIN: 6.4 g/dL (ref 6.1–8.1)

## 2018-09-15 LAB — LIPID PANEL
Cholesterol: 186 mg/dL (ref <200)
HDL: 33 mg/dL — ABNORMAL LOW (ref >=50)
LDL Cholesterol (Calc): 122 mg/dL (calc) — ABNORMAL HIGH
Non-HDL Cholesterol (Calc): 153 mg/dL (calc) — ABNORMAL HIGH (ref <130)
Total CHOL/HDL Ratio: 5.6 (calc) — ABNORMAL HIGH (ref <5.0)
Triglycerides: 190 mg/dL — ABNORMAL HIGH (ref <150)

## 2018-09-15 LAB — T4, FREE: FREE T4: 1 ng/dL (ref 0.8–1.8)

## 2018-09-15 LAB — TSH: TSH: 3.56 m[IU]/L (ref 0.40–4.50)

## 2018-09-22 ENCOUNTER — Ambulatory Visit: Payer: BLUE CROSS/BLUE SHIELD

## 2018-09-22 DIAGNOSIS — F909 Attention-deficit hyperactivity disorder, unspecified type: Secondary | ICD-10-CM

## 2018-09-22 MED ORDER — AMPHETAMINE-DEXTROAMPHET ER 20 MG PO CP24: 20.0000 mg | ORAL_CAPSULE | Freq: Every day | ORAL | 0 refills | Status: DC

## 2018-09-22 NOTE — Progress Notes (Signed)
Patient is here for a blood pressure check and heart rate check per Dr. Sanda Klein. Patient denies chest pain, palpitations, shortness of breath or visual disturbances. At previous visit blood pressure was 118/70 with a heart rate of 86. Today during nurse visit first check blood pressure was 122/80.   Patient spoke with Joelene Millin, CMA and she wants to increase her Adderall because she states the current dosage is not enough.  Patient was notified that Dr. Sanda Klein is not in the office today but the message would be sent to her.

## 2018-09-22 NOTE — Progress Notes (Signed)
I will increase dose, 20 mg, return next Thursday or Friday I asked her to call and get on the nurse schedule, and she'll do that for recheck BP and pulse and check on efficacy next week

## 2018-09-22 NOTE — Assessment & Plan Note (Signed)
Increase dose of stimulant; return in 7-10 days; check pulse, BP, weight, assess efficacy

## 2018-10-05 ENCOUNTER — Ambulatory Visit: Payer: BLUE CROSS/BLUE SHIELD

## 2018-10-05 VITALS — BP 118/80 | HR 73 | Wt 204.3 lb

## 2018-10-05 DIAGNOSIS — Z5181 Encounter for therapeutic drug level monitoring: Secondary | ICD-10-CM

## 2018-10-05 NOTE — Progress Notes (Signed)
Patient her for blood pressure, pulse and weight check since increasing dosage of Adderall.  Pt denies any side effects.  Blood pressure today is 118/80, pulse 73 and weight 204.  Consulted with Dr. Sanda Klein and we will continue current regimen.

## 2018-10-08 ENCOUNTER — Other Ambulatory Visit: Payer: Self-pay | Admitting: Family Medicine

## 2018-10-11 NOTE — Telephone Encounter (Signed)
Please verify that patient is doing well on this dose and wishes to continue; thank you

## 2018-10-11 NOTE — Telephone Encounter (Signed)
Please see my note from 10/11/18 at 6:29 am I need to know if she is doing well on this dose and wants to continue She is in the process of tapering up and is supposed to let me know her response

## 2018-10-12 NOTE — Telephone Encounter (Signed)
Left detailed VM. CRM created.  

## 2018-10-13 ENCOUNTER — Other Ambulatory Visit: Payer: Self-pay | Admitting: Family Medicine

## 2018-10-13 MED ORDER — AMPHETAMINE-DEXTROAMPHET ER 20 MG PO CP24: 20.0000 mg | ORAL_CAPSULE | Freq: Every day | ORAL | 0 refills | Status: DC

## 2018-10-13 NOTE — Telephone Encounter (Signed)
Called pt to R/S her appt and pt wanted to know the outcome of getting a refill on her Adderell. Pt states she sent a MyChart message possibly on 10/08/2018 and she has not heard back. Please advise.

## 2018-10-13 NOTE — Telephone Encounter (Signed)
There is a MyChart message started 10/08/2018 in the chart; Joelene Millin called, left message, CRM generated  I just spoke with patient several minutes ago and took care of her refill request; see documentation about my request for response to therapy, side effects, etc in that refill note dated 10/08/2018

## 2018-10-13 NOTE — Telephone Encounter (Signed)
I called to discuss since I had not heard back from her and she requested a refill She says this is new to her and she didn't really know that she was supposed to give feedback; she says staff checked her vitals and said she could leave  She can tell that there was improvement on the 20 mg strength now that she's off, harder to focus, getting tasks done now that she's been without, so she'll continue the current dose No negative side effects, specifically denied chest pain, feeling jittery, loss of appetite, headache She has had some sx of cold/allergies this week, but not enough to come to the doctor Avoid decongestants, I advised; she doesn't take them anyway she said  ---------------------------------  Staff: When patients come in for BP and pulse checks as we titrate up their stimulant medicine for ADHD, I also want to hear their feedback and experience; please ask and document For example: How do they think the medicine is working for them? Improvement in ability to focus and stay on task? Any other specific benefit they see while on the medicine? Any negative side effects like feeling jittery, palpitations, chest pain, headache, loss of appetite? That will help me know if okay to continue same dose, decrease dose, or if patient needs to be converted right then to an appt with me Thank you

## 2018-10-13 NOTE — Telephone Encounter (Signed)
I am not seeing email?

## 2018-10-14 NOTE — Telephone Encounter (Signed)
Ok will do, but please also place in your note that is what they are also following up for with bp check, so we know and will not forget.

## 2018-11-14 ENCOUNTER — Other Ambulatory Visit: Payer: Self-pay | Admitting: Family Medicine

## 2018-11-14 ENCOUNTER — Ambulatory Visit: Payer: BLUE CROSS/BLUE SHIELD | Admitting: Family Medicine

## 2018-11-16 MED ORDER — AMPHETAMINE-DEXTROAMPHET ER 20 MG PO CP24: 20.0000 mg | ORAL_CAPSULE | Freq: Every day | ORAL | 0 refills | Status: DC

## 2018-11-16 NOTE — Telephone Encounter (Signed)
Reviewed PMP Aware Rx approved

## 2018-11-18 ENCOUNTER — Encounter: Payer: Self-pay | Admitting: Family Medicine

## 2018-11-18 ENCOUNTER — Telehealth: Payer: Self-pay | Admitting: Family Medicine

## 2018-11-18 ENCOUNTER — Other Ambulatory Visit: Payer: Self-pay

## 2018-11-18 ENCOUNTER — Ambulatory Visit (INDEPENDENT_AMBULATORY_CARE_PROVIDER_SITE_OTHER): Payer: BLUE CROSS/BLUE SHIELD | Admitting: Family Medicine

## 2018-11-18 ENCOUNTER — Telehealth: Payer: Self-pay

## 2018-11-18 DIAGNOSIS — E039 Hypothyroidism, unspecified: Secondary | ICD-10-CM | POA: Diagnosis not present

## 2018-11-18 DIAGNOSIS — J309 Allergic rhinitis, unspecified: Secondary | ICD-10-CM

## 2018-11-18 DIAGNOSIS — F909 Attention-deficit hyperactivity disorder, unspecified type: Secondary | ICD-10-CM

## 2018-11-18 MED ORDER — AMPHETAMINE-DEXTROAMPHET ER 20 MG PO CP24: 20.0000 mg | ORAL_CAPSULE | Freq: Every day | ORAL | 0 refills | Status: DC

## 2018-11-18 NOTE — Progress Notes (Signed)
There were no vitals taken for this visit.   Subjective:    Patient ID: Jennifer Whitney, female    DOB: September 05, 1960, 58 y.o.   MRN: [id number]  HPI: Jennifer Whitney is a 58 y.o. female  Chief Complaint  Patient presents with  . Medication Refill    HPI Virtual Visit via Telephone/Video Note   I connected with the patient via:  Doximity video (HIPAA secure) I verified that I am speaking with the correct person using two identifiers.  Call started: 1050 hours Call terminated: 1100 hours Total length of call: 10 minutes   I discussed the limitations, risks, and privacy concerns of performing an evaluation and management service by telephone and the availability of in-person appointments. I explained that he/she may be responsible for charges related to this service. The patient expressed understanding and agreed to proceed.  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, just 4 days a week; cleaning extensively  Patient has ADHD and we have been adjusting her medicine over the last few months She is currently taking Adderall XR 20 mg daily She thinks it is doing good; not sure it is exactly where it needs to be She can "focus good"; sometimes hard to keep thoughts together No chest pain, palpitations, loss of appetite; it does motivate her more to exercise at home They have a workout bench; she has to be with weight size so it does not aggravate her knee; not on feet as much; doing yard work, active at home; can focus to be more organized and get one thing finished  Hypothyroidism No problems with weight or bowel movements; she is eating smoothies Lab Results  Component Value Date   TSH 3.56 09/14/2018   Allergies; using flucticasone and loratidine Enough for her right  now; she forgets the nasal spray sometimes  Depression screen [facility] 2/9 11/18/2018 09/14/2018 07/19/2018 01/31/2018  Decreased Interest 0 0 0 0  Down, Depressed, Hopeless 0 0 0 0  PHQ - 2 Score 0 0 0 0  Altered sleeping 0 0 0 -  Tired, decreased energy 0 0 1 -  Change in appetite 0 0 0 -  Feeling bad or failure about yourself  0 0 0 -  Trouble concentrating 0 0 0 -  Moving slowly or fidgety/restless 0 0 0 -  Suicidal thoughts 0 0 0 -  PHQ-9 Score 0 0 1 -  Difficult doing work/chores Not difficult at all Not difficult at all Not difficult at all -   Fall Risk  11/18/2018 09/14/2018 07/19/2018 01/31/2018  Falls in the past year? 0 0 0 No  Number falls in past yr: 0 0 - -  Injury with Fall? 0 0 - -    Relevant past medical, surgical, family and social history reviewed Past Medical History:  Diagnosis Date  . Acquired hypothyroidism 11/25/2015  . ADHD   . ADHD (attention deficit hyperactivity disorder), combined type 07/20/2018   Evaluation by Dr. Glennon Hamilton, 2019  . Allergic rhinitis 06/14/2017  . Arthritis   . 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 Chronic back pain 06/28/2017  . Depression 11/25/2015  . 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 High cholesterol 06/17/2015   Overview:  The 10-year ASCVD risk score Mikey Bussing DC Jr., et al., 2013) is: 2.4%-2016  . Menopausal syndrome (hot flashes) 06/14/2017  .  Pelvic pressure in female 06/28/2017  . Pneumonia   . Thyroid disease   . Urinary incontinence in female 06/14/2017  . 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 ABDOMINAL HYSTERECTOMY    . Bladder Mesh  2014  . COLONOSCOPY WITH PROPOFOL N/A 04/22/2018   Procedure: COLONOSCOPY WITH PROPOFOL;  Surgeon: Lucilla Lame, MD;  Location: [place];  Service: Endoscopy;  Laterality: N/A;  . POLYPECTOMY N/A 04/22/2018   Procedure: POLYPECTOMY;  Surgeon: Lucilla Lame, MD;  Location: [facility];  Service: Endoscopy;  Laterality: N/A;  . TUBAL  LIGATION     Family History  Problem Relation Age of Onset  . Heart failure Mother   . Lung cancer Mother   . Heart disease Mother   . Stroke Father   . Heart attack Father   . Hypertension Sister   . Hypertension Brother   . Arrhythmia Daughter        SVT  . Heart disease Paternal Grandmother   . Emphysema Paternal Grandfather   . Hypertension Sister   . Prostate cancer Neg Hx   . Kidney cancer Neg Hx   . Breast cancer Neg Hx    Social History   Tobacco Use  . Smoking status: Former Smoker    Packs/day: 1.00    Years: 8.00    Pack years: 8.00    Types: Cigarettes    Start date: 08/10/1977    Last attempt to quit: 08/1985    Years since quitting: 33.2  . Smokeless tobacco: Never Used  Substance Use Topics  . Alcohol use: Yes    Alcohol/week: 1.0 standard drinks    Types: 1 Glasses of wine per week  . Drug use: No     Office Visit from 11/18/2018 in [facility]  AUDIT-C Score  1      Interim medical history since last visit reviewed. Allergies and medications reviewed  Review of Systems Per HPI unless specifically indicated above     Objective:    There were no vitals taken for this visit.  Wt Readings from Last 3 Encounters:  10/05/18 204 lb 4.8 oz (92.7 kg)  09/14/18 210 lb 3.2 oz (95.3 kg)  08/02/18 207 lb (93.9 kg)    Physical Exam Constitutional:      Appearance: Normal appearance.  Eyes:     Extraocular Movements: Extraocular movements intact.     Right eye: Normal extraocular motion.     Left eye: Normal extraocular motion.  Pulmonary:     Effort: No tachypnea or respiratory distress.  Skin:    Coloration: Skin is not jaundiced or pale.  Neurological:     Mental Status: She is alert.     Cranial Nerves: No dysarthria.  Psychiatric:        Mood and Affect: Mood is not anxious or depressed.        Speech: Speech is not rapid and pressured, delayed or slurred.        Assessment & Plan:   Problem List Items Addressed  This Visit      Respiratory   Allergic rhinitis    She'll try to remember to use the nasal spray; continue antihistamine        Endocrine   Acquired hypothyroidism (Chronic)    Last thyroid test normal; will want to avoid over-replacement with her stimulant use for ADHD        Other   Attention deficit hyperactivity disorder (  ADHD)    Medicine is helping, no side effects; continue current dose; will send in a 90 day supply to try to keep her out of the pharmacy; note to pharmacy to CANCEL the 30 day supply (sent in a few days ago) and we'll use the 90 day instead; RTC for visit in 3 months or just before; discussed schedules, routines, upheaval caused by COVID-19 pandemic          Follow up plan: Return in about 3 months (around 02/17/2019) for follow-up visit with Dr. Sanda Klein (or just BEFORE).  An after-visit summary was printed and given to the patient at [place].  Please see the patient instructions which may contain other information and recommendations beyond what is mentioned above in the assessment and plan.  Meds ordered this encounter  Medications  . amphetamine-dextroamphetamine (ADDERALL XR) 20 MG 24 hr capsule    Sig: Take 1 capsule (20 mg total) by mouth daily.    Dispense:  90 capsule    Refill:  0    Please CANCEL the 30 day supply and just use the 90 day instead    No orders of the defined types were placed in this encounter.

## 2018-11-18 NOTE — Assessment & Plan Note (Signed)
She'll try to remember to use the nasal spray; continue antihistamine

## 2018-11-18 NOTE — Assessment & Plan Note (Signed)
Last thyroid test normal; will want to avoid over-replacement with her stimulant use for ADHD

## 2018-11-18 NOTE — Telephone Encounter (Signed)
Appt made

## 2018-11-18 NOTE — Telephone Encounter (Signed)
Pharmacy called something is  Going on with her ins.?  Pt can not afford 90 day supply but can the 30 can you re write for 30 day supply and do 2 refills?

## 2018-11-18 NOTE — Telephone Encounter (Signed)
I called pharmacy Spoke with Dominica Severin who transferred me to another pharmacist, Nira Conn CANCEL the 90 day supply She will use the 30 day supply from April 8th and give her that one now I will send new Rx today via [place] for 30 day supply with 1 additional to cover 3 months total

## 2018-11-18 NOTE — Assessment & Plan Note (Signed)
Medicine is helping, no side effects; continue current dose; will send in a 90 day supply to try to keep her out of the pharmacy; note to pharmacy to CANCEL the 30 day supply (sent in a few days ago) and we'll use the 90 day instead; RTC for visit in 3 months or just before; discussed schedules, routines, upheaval caused by COVID-19 pandemic

## 2018-11-18 NOTE — Telephone Encounter (Signed)
Please schedule patient for an appointment with Dr. Sanda Klein In person On or just BEFORE February 17, 2019 Dx: ADHD, high cholesterol

## 2018-12-25 ENCOUNTER — Other Ambulatory Visit: Payer: Self-pay | Admitting: Family Medicine

## 2018-12-26 MED ORDER — AMPHETAMINE-DEXTROAMPHET ER 20 MG PO CP24: 20.0000 mg | ORAL_CAPSULE | Freq: Every day | ORAL | 0 refills | Status: DC

## 2019-01-05 ENCOUNTER — Telehealth: Payer: Self-pay | Admitting: Family Medicine

## 2019-01-05 NOTE — Telephone Encounter (Signed)
Copied from [place] [id number]. Topic: Quick Communication - Rx Refill/Question >> Jan 05, 2019  3:19 PM Ahmed Prima L wrote: Medication: amphetamine-dextroamphetamine (ADDERALL XR) 20 MG 24 hr capsule ( 90 day supply )  ( last time she gave her a 3 month supply but she could not afford it so she changed it to a 30 day supply. She said she can go ahead and do 90 day supply so she does not have to keep calling ) Has the patient contacted their pharmacy? no (Agent: If no, request that the patient contact the pharmacy for the refill.) (Agent: If yes, when and what did the pharmacy advise?)  Preferred Pharmacy (with phone number or street name): [facility] ([facility]), [place] ([place]) [place] [address] Phone: [phone] Fax: [phone]    Agent: Please be advised that RX refills may take up to 3 business days. We ask that you follow-up with your pharmacy.

## 2019-01-06 NOTE — Telephone Encounter (Signed)
Left detailed VM.  

## 2019-01-06 NOTE — Telephone Encounter (Signed)
We do not refill controlled medications without an appointment.  Please advise on Dr. Sanda Klein leaving and that we will need to meet her in person first.

## 2019-01-09 ENCOUNTER — Telehealth: Payer: Self-pay | Admitting: Family Medicine

## 2019-01-09 ENCOUNTER — Other Ambulatory Visit: Payer: Self-pay | Admitting: Nurse Practitioner

## 2019-01-09 NOTE — Telephone Encounter (Signed)
Copied from [place] [id number]. Topic: Quick Communication - Rx Refill/Question >> Jan 09, 2019 10:59 AM Leward Quan A wrote: Medication: amphetamine-dextroamphetamine (ADDERALL XR) 20 MG 24 hr capsule  ( requesting 3 month)   Have been out for 1 week   Has the patient contacted their pharmacy? Yes.   (Agent: If no, request that the patient contact the pharmacy for the refill.) (Agent: If yes, when and what did the pharmacy advise?)  Preferred Pharmacy (with phone number or street name): [facility] ([facility]), [place] - [place] [phone] (Phone) [phone] (Fax)    Agent: Please be advised that RX refills may take up to 3 business days. We ask that you follow-up with your pharmacy.

## 2019-01-09 NOTE — Telephone Encounter (Signed)
Per Dr. Delight Ovens note in April she provided a 90 day supply and receipt was confirmed by pharmacy. I provided an additional courtesy 30 days supply on may 18th- due to Dr. Delight Ovens leave of absence. Please explain to patient pharmacy should have 90 day supply from April- this is only allowed presently only due to pandemic. However as PCP is no longer working here will need appointment for future refills.

## 2019-01-10 NOTE — Telephone Encounter (Signed)
Spoke with patient and she is reaching out to pharmacy and I advised her to call us back if she needed further assistance from [place].

## 2019-02-02 ENCOUNTER — Telehealth: Payer: Self-pay | Admitting: Family Medicine

## 2019-02-02 NOTE — Telephone Encounter (Signed)
REFILL levothyroxine (SYNTHROID, LEVOTHROID) 75 MCG tablet  [facility] ([facility]), [place] [phone] (Phone) [phone] (Fax)

## 2019-02-03 ENCOUNTER — Other Ambulatory Visit: Payer: Self-pay | Admitting: Family Medicine

## 2019-02-03 MED ORDER — LEVOTHYROXINE SODIUM 75 MCG PO TABS: 75.0000 ug | ORAL_TABLET | Freq: Every day | ORAL | 4 refills | Status: DC

## 2019-02-03 NOTE — Telephone Encounter (Signed)
Called pt and she said that she is going to start going to [facility] since her insurance has changed

## 2019-02-03 NOTE — Telephone Encounter (Signed)
Patient needs follow up in 3 months

## 2019-02-13 ENCOUNTER — Telehealth: Payer: Self-pay | Admitting: Family Medicine

## 2019-02-13 NOTE — Telephone Encounter (Signed)
Patient requesting amphetamine-dextroamphetamine (ADDERALL XR) 20 MG 24 hr capsule, patient is going on vacation and would like Rx sent to [facility] [address] [place] [address] [place], [place] [address]  [phone] and fax # [phone]. Informed patient please allow 72 hour turn around time.

## 2019-02-17 ENCOUNTER — Ambulatory Visit: Payer: BLUE CROSS/BLUE SHIELD | Admitting: Family Medicine

## 2019-02-28 ENCOUNTER — Other Ambulatory Visit: Payer: Self-pay

## 2019-02-28 DIAGNOSIS — Z20822 Contact with and (suspected) exposure to covid-19: Secondary | ICD-10-CM

## 2019-03-02 LAB — NOVEL CORONAVIRUS, NAA: SARS-CoV-2, NAA: NOT DETECTED

## 2019-03-08 NOTE — Progress Notes (Signed)
Negative covid testing.  Please follow appropriate precautions for prevention

## 2019-04-03 ENCOUNTER — Other Ambulatory Visit: Payer: Self-pay | Admitting: Nurse Practitioner

## 2019-04-03 NOTE — Telephone Encounter (Signed)
Patient needs routine appointment within the next month  

## 2019-04-03 NOTE — Telephone Encounter (Signed)
lvm to sch 1 m appt

## 2019-07-28 ENCOUNTER — Other Ambulatory Visit: Payer: Self-pay

## 2019-07-28 MED ORDER — CITALOPRAM HYDROBROMIDE 20 MG PO TABS: 20.0000 mg | ORAL_TABLET | Freq: Every day | ORAL | 0 refills | Status: DC

## 2020-05-09 ENCOUNTER — Other Ambulatory Visit: Payer: Self-pay | Admitting: Family Medicine

## 2020-05-09 DIAGNOSIS — Z1231 Encounter for screening mammogram for malignant neoplasm of breast: Secondary | ICD-10-CM

## 2021-01-02 ENCOUNTER — Ambulatory Visit (INDEPENDENT_AMBULATORY_CARE_PROVIDER_SITE_OTHER): Payer: 59 | Admitting: Nurse Practitioner

## 2021-01-02 ENCOUNTER — Other Ambulatory Visit: Payer: Self-pay

## 2021-01-02 ENCOUNTER — Encounter: Payer: Self-pay | Admitting: Nurse Practitioner

## 2021-01-02 VITALS — BP 138/90 | HR 79 | Temp 98.2°F | Ht 66.02 in | Wt 197.4 lb

## 2021-01-02 DIAGNOSIS — M25561 Pain in right knee: Secondary | ICD-10-CM

## 2021-01-02 DIAGNOSIS — E039 Hypothyroidism, unspecified: Secondary | ICD-10-CM | POA: Diagnosis not present

## 2021-01-02 DIAGNOSIS — G8929 Other chronic pain: Secondary | ICD-10-CM

## 2021-01-02 DIAGNOSIS — R32 Unspecified urinary incontinence: Secondary | ICD-10-CM | POA: Diagnosis not present

## 2021-01-02 DIAGNOSIS — F325 Major depressive disorder, single episode, in full remission: Secondary | ICD-10-CM

## 2021-01-02 DIAGNOSIS — Z1231 Encounter for screening mammogram for malignant neoplasm of breast: Secondary | ICD-10-CM | POA: Diagnosis not present

## 2021-01-02 DIAGNOSIS — F909 Attention-deficit hyperactivity disorder, unspecified type: Secondary | ICD-10-CM

## 2021-01-02 DIAGNOSIS — Z7689 Persons encountering health services in other specified circumstances: Secondary | ICD-10-CM

## 2021-01-02 NOTE — Assessment & Plan Note (Signed)
Chronic, stable. Well controlled on adderall xr 20 mg daily and 5mg  daily prn. She would like to continue this regimen. Benefits and risks discussed. Controlled contract substance filled out. Urine drug screen today. She does not need a refill at this point. PDMP reviewed. She will contact us when she needs a refill. Follow-up in 4 months.

## 2021-01-02 NOTE — Assessment & Plan Note (Signed)
Currently taking levothyroxine either 41mcg or 88mg . She will verify the dose and let us know. She would like her TSH checked today to make sure she is taking the correct dose that she needs. TSH ordered. Will treat based on results.

## 2021-01-02 NOTE — Assessment & Plan Note (Signed)
Chronic, stable. States her mood is well controlled with celexa. Her PHQ-9 score is 7 today. Will continue celexa. Of note, she is being evicted at the end of September. Will have social work reach out to her to see if there are any resources available. She currently denies SI/HI. Follow-up in 6 months.

## 2021-01-02 NOTE — Assessment & Plan Note (Signed)
Chronic. She had a bladder mesh surgery in the past that didn't work. She currently uses a pessary to help with her symptoms. She will let me know if she wants a referral to urology in the future as she said the next step would be another surgery.

## 2021-01-02 NOTE — Patient Instructions (Signed)
[facility]  Address: [address], [place], [place] [address]  Phone: [phone]  

## 2021-01-02 NOTE — Assessment & Plan Note (Signed)
Currently following with orthopedics. She is active in physical therapy with [facility]. She also sees a Restaurant manager, fast food routinely. She states that she has had several knee injections in the past and that the next step will most likely be a knee replacement. She takes meloxicam to help with the pain. Continue collaboration with ortho.

## 2021-01-02 NOTE — Progress Notes (Signed)
New Patient Office Visit  Subjective:  Patient ID: Jennifer Whitney, female    DOB: [birth year]  Age: 60 y.o. MRN: [id number]  CC:  Chief Complaint  Patient presents with  . New Patient (Initial Visit)    To establish care  . ADHD  . Knee Pain  . Depression    HPI Jennifer Whitney presents for new patient visit to establish care.  Introduced to Designer, jewellery role and practice setting.  All questions answered.  Discussed provider/patient relationship and expectations. Her last two primary care providers left the office.    DEPRESSION  Mood status: controlled Satisfied with current treatment?: yes Symptom severity: mild  Duration of current treatment : chronic Side effects: no Medication compliance: excellent compliance Psychotherapy/counseling: no  Previous psychiatric medications: celexa and prozac --she did not tolerate prozac well in the past Depressed mood: no Anxious mood: no Anhedonia: no Significant weight loss or gain: no Insomnia: yes hard to fall asleep Fatigue: yes Feelings of worthlessness or guilt: no Impaired concentration/indecisiveness: yes Suicidal ideations: no Hopelessness: no Crying spells: yes Depression screen [facility] 2/9 01/02/2021 11/18/2018 09/14/2018 07/19/2018 01/31/2018  Decreased Interest 0 0 0 0 0  Down, Depressed, Hopeless 0 0 0 0 0  PHQ - 2 Score 0 0 0 0 0  Altered sleeping 1 0 0 0 -  Tired, decreased energy 3 0 0 1 -  Change in appetite 3 0 0 0 -  Feeling bad or failure about yourself  0 0 0 0 -  Trouble concentrating 0 0 0 0 -  Moving slowly or fidgety/restless 0 0 0 0 -  Suicidal thoughts 0 0 0 0 -  PHQ-9 Score 7 0 0 1 -  Difficult doing work/chores Not difficult at all Not difficult at all Not difficult at all Not difficult at all -    KNEE PAIN  Duration: chronic Involved knee: right Mechanism of injury: history of playing many sports when she was younger Location:medial Onset: gradual Severity: moderate  Quality:   aching Frequency: constant Radiation: no Aggravating factors: walking, stairs and movement  Alleviating factors: ice, physical therapy, NSAIDs, brace and rest  Status: fluctuating Treatments attempted: rest, ice, heat, ibuprofen and physical therapy  Relief with NSAIDs?:  mild Weakness with weight bearing or walking: yes Sensation of giving way: yes Locking: yes Popping: yes Bruising: no Swelling: yes Redness: no Paresthesias/decreased sensation: no Fevers: no  ADHD  Was diagnosed with ADHD by Dr. Glennon Hamilton in 2019. She has been on adderall xr 20mg  daily and then adderall 5mg  daily prn. Her symptoms are well controlled with this medication. She denies any side effects. She would like to continue this regimen.    Past Medical History:  Diagnosis Date  . Acquired hypothyroidism 11/25/2015  . ADHD   . ADHD (attention deficit hyperactivity disorder), combined type 07/20/2018   Evaluation by Dr. Glennon Hamilton, 2019  . Allergic rhinitis 06/14/2017  . Arthritis   . 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 Chronic back pain 06/28/2017  . Depression 11/25/2015  . 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 High cholesterol 06/17/2015   Overview:  The 10-year ASCVD risk score Mikey Bussing DC Jr., et al., 2013) is: 2.4%-2016  . Menopausal syndrome (hot flashes) 06/14/2017  . Pelvic pressure in female 06/28/2017  . Pneumonia   . Thyroid disease   . Urinary incontinence in female 06/14/2017  . 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 ABDOMINAL HYSTERECTOMY    .  Bladder Mesh  2014  . COLONOSCOPY WITH PROPOFOL N/A 04/22/2018   Procedure: COLONOSCOPY WITH PROPOFOL;  Surgeon: Lucilla Lame, MD;  Location: [place];  Service: Endoscopy;  Laterality: N/A;  . POLYPECTOMY N/A 04/22/2018   Procedure: POLYPECTOMY;  Surgeon: Lucilla Lame, MD;  Location: [place];  Service: Endoscopy;  Laterality: N/A;  . TUBAL LIGATION      Family History  Problem Relation  Age of Onset  . Heart failure Mother   . Lung cancer Mother   . Heart disease Mother   . Stroke Father   . Heart attack Father   . Hypertension Sister   . Hypertension Brother   . Arrhythmia Daughter        SVT  . Heart disease Paternal Grandmother   . Emphysema Paternal Grandfather   . Hypertension Sister   . Prostate cancer Neg Hx   . Kidney cancer Neg Hx   . Breast cancer Neg Hx     Social History   Socioeconomic History  . Marital status: Married    Spouse name: Jori Moll  . Number of children: 6  . Years of education: Not on file  . Highest education level: Associate degree: academic program  Occupational History  . Occupation: PE Instructor  Tobacco Use  . Smoking status: Former Smoker    Packs/day: 1.00    Years: 8.00    Pack years: 8.00    Types: Cigarettes    Start date: 08/10/1977    Quit date: 08/1985    Years since quitting: 35.4  . Smokeless tobacco: Never Used  Vaping Use  . Vaping Use: Never used  Substance and Sexual Activity  . Alcohol use: Yes    Alcohol/week: 1.0 standard drink    Types: 1 Glasses of wine per week  . Drug use: No  . Sexual activity: Not Currently    Partners: Male  Other Topics Concern  . Not on file  Social History Narrative  . Not on file   Social Determinants of Health   Financial Resource Strain: Not on file  Food Insecurity: Not on file  Transportation Needs: Not on file  Physical Activity: Not on file  Stress: Not on file  Social Connections: Not on file  Intimate Partner Violence: Not on file    ROS Review of Systems  Constitutional: Positive for fatigue.  HENT: Negative.   Eyes: Positive for itching. Negative for pain and discharge.  Respiratory: Negative.   Cardiovascular: Negative.   Gastrointestinal: Negative.   Endocrine: Positive for heat intolerance.  Genitourinary:       Bladder prolapse, urinary incontinence  Musculoskeletal: Positive for arthralgias (right knee pain) and back pain.  Skin:  Negative.   Allergic/Immunologic: Positive for environmental allergies.  Neurological: Negative.   Psychiatric/Behavioral: Negative.     Objective:   Today's Vitals: BP 138/90   Pulse 79   Temp 98.2 F (36.8 C) (Oral)   Ht 5' 6.02" (1.677 m)   Wt 197 lb 6.4 oz (89.5 kg)   SpO2 96%   BMI 31.84 kg/m   Physical Exam Vitals and nursing note reviewed.  Constitutional:      General: She is not in acute distress.    Appearance: Normal appearance.  HENT:     Head: Normocephalic.  Eyes:     Conjunctiva/sclera: Conjunctivae normal.  Cardiovascular:     Rate and Rhythm: Normal rate and regular rhythm.     Pulses: Normal pulses.     Heart sounds: Normal heart  sounds.  Pulmonary:     Effort: Pulmonary effort is normal.     Breath sounds: Normal breath sounds.  Abdominal:     Palpations: Abdomen is soft.     Tenderness: There is no abdominal tenderness.  Musculoskeletal:        General: Swelling (right knee) present. Normal range of motion.     Cervical back: Normal range of motion.  Skin:    General: Skin is warm.  Neurological:     General: No focal deficit present.     Mental Status: She is alert and oriented to person, place, and time.  Psychiatric:        Mood and Affect: Mood normal.        Behavior: Behavior normal.        Thought Content: Thought content normal.        Judgment: Judgment normal.     Assessment & Plan:   Problem List Items Addressed This Visit      Endocrine   Acquired hypothyroidism (Chronic)    Currently taking levothyroxine either 10mcg or 88mg . She will verify the dose and let us know. She would like her TSH checked today to make sure she is taking the correct dose that she needs. TSH ordered. Will treat based on results.       Relevant Orders   TSH     Other   Depression, major, single episode, complete remission (HCC)    Chronic, stable. States her mood is well controlled with celexa. Her PHQ-9 score is 7 today. Will continue celexa.  Of note, she is being evicted at the end of September. Will have social work reach out to her to see if there are any resources available. She currently denies SI/HI. Follow-up in 6 months.       Relevant Orders   AMB Referral to [facility]   Urinary incontinence in female    Chronic. She had a bladder mesh surgery in the past that didn't work. She currently uses a pessary to help with her symptoms. She will let me know if she wants a referral to urology in the future as she said the next step would be another surgery.       Attention deficit hyperactivity disorder (ADHD) - Primary    Chronic, stable. Well controlled on adderall xr 20 mg daily and 5mg  daily prn. She would like to continue this regimen. Benefits and risks discussed. Controlled contract substance filled out. Urine drug screen today. She does not need a refill at this point. PDMP reviewed. She will contact us when she needs a refill. Follow-up in 4 months.       Relevant Orders   Urine drugs of abuse scrn w alc, routine (Ref Lab)   Chronic pain of right knee    Currently following with orthopedics. She is active in physical therapy with [facility]. She also sees a Restaurant manager, fast food routinely. She states that she has had several knee injections in the past and that the next step will most likely be a knee replacement. She takes meloxicam to help with the pain. Continue collaboration with ortho.        Other Visit Diagnoses    Encounter for screening mammogram for malignant neoplasm of breast       Screening mammogram ordered today   Relevant Orders   MM 3D SCREEN BREAST BILATERAL   Encounter to establish care          Outpatient Encounter Medications as of  01/02/2021  Medication Sig  . acetaminophen (TYLENOL) 325 MG tablet Take by mouth.  Marland Kitchen amphetamine-dextroamphetamine (ADDERALL XR) 20 MG 24 hr capsule Take 1 capsule (20 mg total) by mouth daily.  . citalopram (CELEXA) 20 MG tablet Take 1 tablet (20 mg total)  by mouth daily.  Marland Kitchen loratadine (CLARITIN) 10 MG tablet Take 10 mg by mouth daily.  . meloxicam (MOBIC) 7.5 MG tablet meloxicam 7.5 mg tablet  TAKE 1 TO 2 TABLETS BY MOUTH AS DIRECTED FOR ARTHRITIS  . cyclobenzaprine (FLEXERIL) 5 MG tablet cyclobenzaprine 5 mg tablet  TAKE 1 TABLET BY MOUTH THREE TIMES DAILY  . EUTHYROX 75 MCG tablet TAKE 1 TABLET BY MOUTH ONCE DAILY BEFORE BREAKFAST (Patient not taking: Reported on 01/02/2021)  . fluticasone (FLONASE) 50 MCG/ACT nasal spray Place 2 sprays into the nose as needed.   No facility-administered encounter medications on file as of 01/02/2021.    Follow-up: Return in about 4 months (around 05/05/2021) for after 9/29 for cpe.   Charyl Dancer, NP

## 2021-01-03 LAB — TSH: TSH: 4.53 u[IU]/mL — ABNORMAL HIGH (ref 0.450–4.500)

## 2021-01-07 MED ORDER — LEVOTHYROXINE SODIUM 100 MCG PO TABS: 100.0000 ug | ORAL_TABLET | Freq: Every day | ORAL | 2 refills | Status: DC

## 2021-01-07 NOTE — Addendum Note (Signed)
Addended by: Vance Peper A on: 01/07/2021 10:16 AM   Modules accepted: Orders

## 2021-01-14 LAB — AMPHETAMINE CONF, UR
Amphetamine GC/MS Conf: 2780 ng/mL
Amphetamine: POSITIVE — AB
Amphetamines: POSITIVE — AB
Methamphetamine: NEGATIVE

## 2021-01-14 LAB — URINE DRUGS OF ABUSE SCREEN W ALC, ROUTINE (REF LAB)
Barbiturate Quant, Ur: NEGATIVE ng/mL
Benzodiazepine Quant, Ur: NEGATIVE ng/mL
Cannabinoid Quant, Ur: NEGATIVE ng/mL
Cocaine (Metab.): NEGATIVE ng/mL
Ethanol, Urine: NEGATIVE %
Methadone Screen, Urine: NEGATIVE ng/mL
Opiate Quant, Ur: NEGATIVE ng/mL
PCP Quant, Ur: NEGATIVE ng/mL
Propoxyphene: NEGATIVE ng/mL

## 2021-01-22 ENCOUNTER — Telehealth: Payer: Self-pay | Admitting: Nurse Practitioner

## 2021-01-22 NOTE — Telephone Encounter (Signed)
   Telephone encounter was:  Unsuccessful.  01/22/2021 Name: Jennifer Whitney MRN: [id number] DOB: 1961-04-19  Unsuccessful outbound call made today to assist with:   finding income based housing  Outreach Attempt:  1st Attempt  A HIPAA compliant voice message was left requesting a return call.  Instructed patient to call back at [phone].  [place], Care Management Phone: [phone] Email: [email or web address]

## 2021-01-27 ENCOUNTER — Telehealth: Payer: Self-pay | Admitting: Nurse Practitioner

## 2021-01-27 ENCOUNTER — Other Ambulatory Visit: Payer: Self-pay | Admitting: Nurse Practitioner

## 2021-01-27 NOTE — Telephone Encounter (Signed)
   Telephone encounter was:  Unsuccessful.  01/27/2021 Name: DEERICA WASZAK MRN: [id number] DOB: 10-03-1960  Unsuccessful outbound call made today to assist with:   income based housing  Outreach Attempt:  2nd Attempt  A HIPAA compliant voice message was left requesting a return call.  Instructed patient to call back at [phone].  [place], Care Management Phone: [phone] Email: [email or web address]

## 2021-01-27 NOTE — Telephone Encounter (Signed)
Pt is out of town and scheduled for 01/03/2021

## 2021-01-30 ENCOUNTER — Telehealth: Payer: Self-pay | Admitting: Nurse Practitioner

## 2021-01-30 NOTE — Telephone Encounter (Signed)
   Telephone encounter was:  Unsuccessful.  01/30/2021 Name: Jennifer Whitney MRN: [id number] DOB: [birth year]  Unsuccessful outbound call made today to assist with:   income based housing  Outreach Attempt:  3rd Attempt.  Referral closed unable to contact patient.  A HIPAA compliant voice message was left requesting a return call.  Instructed patient to call back at [phone].  [place], Care Management Phone: [phone] Email: [email or web address]

## 2021-02-03 ENCOUNTER — Other Ambulatory Visit: Payer: Self-pay

## 2021-02-03 ENCOUNTER — Ambulatory Visit (INDEPENDENT_AMBULATORY_CARE_PROVIDER_SITE_OTHER): Payer: 59 | Admitting: Nurse Practitioner

## 2021-02-03 ENCOUNTER — Encounter: Payer: Self-pay | Admitting: Nurse Practitioner

## 2021-02-03 VITALS — BP 113/74 | HR 84 | Temp 98.4°F | Wt 198.2 lb

## 2021-02-03 DIAGNOSIS — M545 Low back pain, unspecified: Secondary | ICD-10-CM | POA: Diagnosis not present

## 2021-02-03 DIAGNOSIS — E039 Hypothyroidism, unspecified: Secondary | ICD-10-CM

## 2021-02-03 DIAGNOSIS — N3001 Acute cystitis with hematuria: Secondary | ICD-10-CM

## 2021-02-03 MED ORDER — FLUCONAZOLE 150 MG PO TABS: 150.0000 mg | ORAL_TABLET | Freq: Once | ORAL | 0 refills | Status: AC

## 2021-02-03 MED ORDER — NITROFURANTOIN MONOHYD MACRO 100 MG PO CAPS: 100.0000 mg | ORAL_CAPSULE | Freq: Two times a day (BID) | ORAL | 0 refills | Status: DC

## 2021-02-03 NOTE — Progress Notes (Signed)
Acute Office Visit  Subjective:    Patient ID: Jennifer Whitney, female    DOB: [birth year], 60 y.o.   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 States the only symptom she has is lower back pain.      HPI Patient is in today for possible UTI from a recent DOT physical. She endorses lower back pain.   URINARY SYMPTOMS  Dysuria: no Urinary frequency: no Urgency: no Small volume voids: no Symptom severity:  mild Urinary incontinence: no Foul odor: yes Hematuria: no Abdominal pain: no Back pain: no Suprapubic pain/pressure: yes Flank pain: no Fever:  no Vomiting: no Relief with cranberry juice: no Relief with pyridium: no Status: stable Previous urinary tract infection: yes Recurrent urinary tract infection: no History of sexually transmitted disease: yes Treatments attempted: none    Past Medical History:  Diagnosis Date   Acquired hypothyroidism 11/25/2015   ADHD    ADHD (attention deficit hyperactivity disorder), combined type 07/20/2018   Evaluation by Dr. 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    Past Surgical History:  Procedure Laterality Date   ABDOMINAL HYSTERECTOMY     Bladder Mesh  2014   COLONOSCOPY WITH PROPOFOL N/A 04/22/2018   Procedure: COLONOSCOPY WITH PROPOFOL;  Surgeon: Lucilla Lame, MD;  Location: [place];  Service: Endoscopy;  Laterality: N/A;    POLYPECTOMY N/A 04/22/2018   Procedure: POLYPECTOMY;  Surgeon: Lucilla Lame, MD;  Location: [place];  Service: Endoscopy;  Laterality: N/A;   TUBAL LIGATION      Family History  Problem Relation Age of Onset   Heart failure Mother    Lung cancer Mother    Heart disease Mother    Stroke Father    Heart attack Father    Hypertension Sister    Hypertension Brother    Arrhythmia Daughter        SVT   Heart disease Paternal Grandmother    Emphysema Paternal Grandfather    Hypertension Sister    Prostate cancer Neg Hx    Kidney cancer Neg Hx    Breast cancer Neg Hx     Social History   Socioeconomic History   Marital status: Married    Spouse name: Jori Moll   Number of children: 6   Years of education: Not on file   Highest education level: Associate degree: academic program  Occupational History   Occupation: PE Instructor  Tobacco Use   Smoking status: Former    Packs/day: 1.00    Years: 8.00    Pack years: 8.00    Types: Cigarettes    Start date: 08/10/1977    Quit date: 08/1985    Years since quitting: 35.5   Smokeless tobacco: Never  Vaping Use   Vaping Use: Never used  Substance and Sexual Activity   Alcohol use: Yes  Alcohol/week: 1.0 standard drink    Types: 1 Glasses of wine per week   Drug use: No   Sexual activity: Not Currently    Partners: Male  Other Topics Concern   Not on file  Social History Narrative   Not on file   Social Determinants of Health   Financial Resource Strain: Not on file  Food Insecurity: Not on file  Transportation Needs: Not on file  Physical Activity: Not on file  Stress: Not on file  Social Connections: Not on file  Intimate Partner Violence: Not on file    Outpatient Medications Prior to Visit  Medication Sig Dispense Refill   acetaminophen (TYLENOL) 325 MG tablet Take by mouth.     amphetamine-dextroamphetamine (ADDERALL XR) 20 MG 24 hr capsule Take 1 capsule (20 mg total) by mouth daily. 30 capsule 0    citalopram (CELEXA) 20 MG tablet Take 1 tablet (20 mg total) by mouth daily. 90 tablet 0   levothyroxine (SYNTHROID) 100 MCG tablet Take 1 tablet (100 mcg total) by mouth daily. 30 tablet 2   loratadine (CLARITIN) 10 MG tablet Take 10 mg by mouth daily.     meloxicam (MOBIC) 7.5 MG tablet meloxicam 7.5 mg tablet  TAKE 1 TO 2 TABLETS BY MOUTH AS DIRECTED FOR ARTHRITIS     cyclobenzaprine (FLEXERIL) 5 MG tablet cyclobenzaprine 5 mg tablet  TAKE 1 TABLET BY MOUTH THREE TIMES DAILY     fluticasone (FLONASE) 50 MCG/ACT nasal spray Place 2 sprays into the nose as needed.     No facility-administered medications prior to visit.    No Known Allergies  Review of Systems  Constitutional:  Positive for fatigue.  HENT: Negative.    Respiratory: Negative.    Cardiovascular: Negative.   Gastrointestinal:  Positive for nausea. Negative for abdominal pain.  Genitourinary:  Positive for frequency and urgency. Negative for dysuria.  Musculoskeletal: Negative.   Skin: Negative.   Neurological: Negative.       Objective:    Physical Exam Vitals and nursing note reviewed.  Constitutional:      General: She is not in acute distress.    Appearance: Normal appearance.  HENT:     Head: Normocephalic.  Eyes:     Conjunctiva/sclera: Conjunctivae normal.  Cardiovascular:     Rate and Rhythm: Normal rate and regular rhythm.     Pulses: Normal pulses.     Heart sounds: Normal heart sounds.  Pulmonary:     Effort: Pulmonary effort is normal.     Breath sounds: Normal breath sounds.  Abdominal:     Palpations: Abdomen is soft.     Tenderness: There is no abdominal tenderness.  Musculoskeletal:     Cervical back: Normal range of motion.  Skin:    General: Skin is warm.  Neurological:     General: No focal deficit present.     Mental Status: She is alert and oriented to person, place, and time.  Psychiatric:        Mood and Affect: Mood normal.        Behavior: Behavior normal.        Thought  Content: Thought content normal.        Judgment: Judgment normal.    BP 113/74   Pulse 84   Temp 98.4 F (36.9 C) (Oral)   Wt 198 lb 3.2 oz (89.9 kg)   SpO2 98%   BMI 31.97 kg/m  Wt Readings from Last 3 Encounters:  02/03/21 198 lb 3.2  oz (89.9 kg)  01/02/21 197 lb 6.4 oz (89.5 kg)  10/05/18 204 lb 4.8 oz (92.7 kg)    Health Maintenance Due  Topic Date Due   COVID-19 Vaccine (1) Never done   HIV Screening  Never done   Hepatitis C Screening  Never done   Zoster Vaccines- Shingrix (1 of 2) Never done   MAMMOGRAM  03/11/2019    There are no preventive care reminders to display for this patient.   Lab Results  Component Value Date   TSH 4.530 (H) 01/02/2021   Lab Results  Component Value Date   WBC 4.8 05/11/2018   HGB 13.2 05/11/2018   HCT 38.6 05/11/2018   MCV 90.6 05/11/2018   PLT 232 05/11/2018   Lab Results  Component Value Date   NA 142 09/14/2018   K 4.0 09/14/2018   CO2 29 09/14/2018   GLUCOSE 90 09/14/2018   BUN 14 09/14/2018   CREATININE 0.96 09/14/2018   BILITOT 0.3 09/14/2018   AST 21 09/14/2018   ALT 27 09/14/2018   PROT 6.4 09/14/2018   CALCIUM 9.1 09/14/2018   ANIONGAP 4 (L) 01/05/2014   Lab Results  Component Value Date   CHOL 186 09/14/2018   Lab Results  Component Value Date   HDL 33 (L) 09/14/2018   Lab Results  Component Value Date   LDLCALC 122 (H) 09/14/2018   Lab Results  Component Value Date   TRIG 190 (H) 09/14/2018   Lab Results  Component Value Date   CHOLHDL 5.6 (H) 09/14/2018   No results found for: HGBA1C     Assessment & Plan:   Problem List Items Addressed This Visit       Endocrine   Acquired hypothyroidism (Chronic)    Levothyroxine dose adjusted last visit. Endorses increased fatigue. Will check TSH today and adjust regimen as needed.        Relevant Orders   TSH   Other Visit Diagnoses     Right low back pain, unspecified chronicity, unspecified whether sciatica present    -  Primary    U/A positive for leukocytes, protein, and blood. Will treat for UTI.    Relevant Orders   Urinalysis, Routine w reflex microscopic   Acute cystitis with hematuria       Will add on urine culture. Treat with macrobid and diflucan for yeast with antibiotics. Increase fluids. Follow up if symptoms worsen or don't improve   Relevant Orders   Urine Culture        Meds ordered this encounter  Medications   nitrofurantoin, macrocrystal-monohydrate, (MACROBID) 100 MG capsule    Sig: Take 1 capsule (100 mg total) by mouth 2 (two) times daily.    Dispense:  10 capsule    Refill:  0   fluconazole (DIFLUCAN) 150 MG tablet    Sig: Take 1 tablet (150 mg total) by mouth once for 1 dose.    Dispense:  1 tablet    Refill:  0      Charyl Dancer, NP

## 2021-02-03 NOTE — Assessment & Plan Note (Signed)
Levothyroxine dose adjusted last visit. Endorses increased fatigue. Will check TSH today and adjust regimen as needed.

## 2021-02-04 LAB — URINALYSIS, ROUTINE W REFLEX MICROSCOPIC
Bilirubin, UA: NEGATIVE
Glucose, UA: NEGATIVE
Nitrite, UA: NEGATIVE
Specific Gravity, UA: 1.02 (ref 1.005–1.030)
Urobilinogen, Ur: 0.2 mg/dL (ref 0.2–1.0)
pH, UA: 7 (ref 5.0–7.5)

## 2021-02-04 LAB — MICROSCOPIC EXAMINATION: Bacteria, UA: NONE SEEN

## 2021-02-04 LAB — TSH: TSH: 3.44 u[IU]/mL (ref 0.450–4.500)

## 2021-02-06 LAB — URINE CULTURE

## 2021-03-04 ENCOUNTER — Other Ambulatory Visit: Payer: 59

## 2021-03-04 ENCOUNTER — Other Ambulatory Visit: Payer: Self-pay

## 2021-03-04 DIAGNOSIS — E039 Hypothyroidism, unspecified: Secondary | ICD-10-CM

## 2021-03-05 LAB — TSH: TSH: 3.18 u[IU]/mL (ref 0.450–4.500)

## 2021-04-15 ENCOUNTER — Telehealth: Payer: Self-pay | Admitting: Nurse Practitioner

## 2021-04-15 NOTE — Telephone Encounter (Signed)
Called pt to reschedule appointment. New appt was made.

## 2021-04-30 ENCOUNTER — Other Ambulatory Visit: Payer: Self-pay | Admitting: Nurse Practitioner

## 2021-04-30 MED ORDER — AMPHETAMINE-DEXTROAMPHET ER 20 MG PO CP24: 20.0000 mg | ORAL_CAPSULE | Freq: Every day | ORAL | 0 refills | Status: DC

## 2021-04-30 NOTE — Telephone Encounter (Signed)
Requested medication (s) are due for refill today: Yes  Requested medication (s) are on the active medication list: Yes  Last refill:  12/26/18  Future visit scheduled: Yes  Notes to clinic:  See request.    Requested Prescriptions  Pending Prescriptions Disp Refills   amphetamine-dextroamphetamine (ADDERALL XR) 20 MG 24 hr capsule 30 capsule 0    Sig: Take 1 capsule (20 mg total) by mouth daily.     Not Delegated - Psychiatry:  Stimulants/ADHD Failed - 04/30/2021  1:59 PM      Failed - This refill cannot be delegated      Failed - Urine Drug Screen completed in last 360 days      Passed - Valid encounter within last 3 months    Recent Outpatient Visits           2 months ago Right low back pain, unspecified chronicity, unspecified whether sciatica present   [place], Lauren A, NP   3 months ago Attention deficit hyperactivity disorder (ADHD), unspecified ADHD type   [place], Lauren A, NP   2 years ago Allergic rhinitis, unspecified seasonality, unspecified trigger   [facility], Satira Anis, MD   2 years ago Acquired hypothyroidism   [facility] Lada, Satira Anis, MD   2 years ago Attention and concentration deficit   [place], Satira Anis, MD       Future Appointments             In 3 weeks McElwee, Scheryl Darter, NP [facility]

## 2021-04-30 NOTE — Telephone Encounter (Signed)
She had an appointment on 04/15/21 but it was rescheduled due to provider being out of office. Will give her enough to get to her rescheduled appointment.

## 2021-04-30 NOTE — Telephone Encounter (Signed)
Copied from [place] [id number]. Topic: Quick Communication - Rx Refill/Question >> Apr 30, 2021  1:31 PM Leward Quan A wrote: Medication: amphetamine-dextroamphetamine (ADDERALL XR) 20 MG 24 hr capsule Asking for an increase because effects wear off, looses focus in afternoon completely out  Has the patient contacted their pharmacy? No. (Agent: If no, request that the patient contact the pharmacy for the refill.) (Agent: If yes, when and what did the pharmacy advise?)  Preferred Pharmacy (with phone number or street name): [facility] [place] [place] - [address]  Phone:  [phone] Fax:  [phone]    Has the patient been seen for an appointment in the last year OR does the patient have an upcoming appointment? Yes.    Agent: Please be advised that RX refills may take up to 3 business days. We ask that you follow-up with your pharmacy.

## 2021-05-08 ENCOUNTER — Ambulatory Visit: Payer: Self-pay | Admitting: *Deleted

## 2021-05-08 ENCOUNTER — Other Ambulatory Visit: Payer: Self-pay

## 2021-05-08 ENCOUNTER — Ambulatory Visit
Admission: EM | Admit: 2021-05-08 | Discharge: 2021-05-08 | Disposition: A | Discharge status: Home / Self Care | Payer: 59 | Attending: Emergency Medicine | Admitting: Emergency Medicine

## 2021-05-08 ENCOUNTER — Ambulatory Visit: Payer: 59 | Admitting: Nurse Practitioner

## 2021-05-08 DIAGNOSIS — R29810 Facial weakness: Secondary | ICD-10-CM

## 2021-05-08 DIAGNOSIS — G510 Bell's palsy: Secondary | ICD-10-CM | POA: Diagnosis not present

## 2021-05-08 DIAGNOSIS — H5789 Other specified disorders of eye and adnexa: Secondary | ICD-10-CM | POA: Diagnosis not present

## 2021-05-08 MED ORDER — METHYLPREDNISOLONE SODIUM SUCC 125 MG IJ SOLR
125.0000 mg | Freq: Once | INTRAMUSCULAR | Status: AC
  Administered 2021-05-08: 125 mg via INTRAMUSCULAR

## 2021-05-08 MED ORDER — VALACYCLOVIR HCL 1 G PO TABS: 1000.0000 mg | ORAL_TABLET | Freq: Three times a day (TID) | ORAL | 0 refills | Status: AC

## 2021-05-08 MED ORDER — PREDNISONE 10 MG (21) PO TBPK: ORAL_TABLET | Freq: Every day | ORAL | 0 refills | Status: DC

## 2021-05-08 NOTE — Discharge Instructions (Addendum)
Use saline drops if eye becomes to dry out  Currently able to open and close lid if not will need a patch to cover eye Will need to see pcp this week  Go to ER if symptoms become worse or you notice any weakness to extremities.

## 2021-05-08 NOTE — Telephone Encounter (Signed)
FYI for appointment this morning.  

## 2021-05-08 NOTE — Telephone Encounter (Signed)
Pt reports left eye watering, left side of mouth drooping, "Can't suck up on straw." Onset 7pm last night with lip tingling. Has not worsened since this AM. States "My husband and son both had Bel's Palsy and I think this is it." CAn move all extremities, no unilateral weakness.  Advised ED per protocol, declines. "Can't afford that." Reiterated need for ED, declines. NT called practice, [facility]bal[facility] Glassing, asked for clinician, unavailable. Advised to send triage summary. I did secure appt with Lauren for 1020 this morning.  Advised pt may be sent to ED.  States she may do that "I have to call my husband" and hung up.

## 2021-05-08 NOTE — Telephone Encounter (Signed)
Noted. Agree with needing to go to ER for further evaluation of symptoms.

## 2021-05-08 NOTE — ED Provider Notes (Signed)
[facility]    CSN: [id number] Arrival date & time: 05/08/21  0944      History   Chief Complaint Chief Complaint  Patient presents with   Facial Droop    Left side    HPI Jennifer Whitney is a 60 y.o. female.   Last night at 7pm going to church she noticed a tingle sensation like a cold sore to lt side of lip then lt side facial droop and watery of lt eye. States that she had no extremity weakness, no headache, no blurred vision. Denies any congestion, cough, or upper resp sx. Has not had any vaccines recently. Alert x4. Pts husband and son had belys palsy before and these are the same sx per pt. Has not taken anything pta.    Past Medical History:  Diagnosis Date   Acquired hypothyroidism 11/25/2015   ADHD    ADHD (attention deficit hyperactivity disorder), combined type 07/20/2018   Evaluation by Dr. 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    Patient Active Problem List   Diagnosis Date Noted   Chronic pain of right knee 01/02/2021   Family history of heart disease 08/02/2018   Mixed hyperlipidemia 08/02/2018   Attention deficit hyperactivity disorder (ADHD) 07/20/2018   Polyp of sigmoid colon    Arthralgia 01/31/2018   Obesity (BMI 30.0-34.9) 01/31/2018   Chronic back pain 06/28/2017   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   Allergic rhinitis 06/14/2017   Depression, major, single episode, complete remission ([place]) 06/14/2017   Menopausal syndrome (hot flashes) 06/14/2017   Urinary incontinence in female  06/14/2017   Acquired hypothyroidism 11/25/2015    Past Surgical History:  Procedure Laterality Date   ABDOMINAL HYSTERECTOMY     Bladder Mesh  2014   COLONOSCOPY WITH PROPOFOL N/A 04/22/2018   Procedure: COLONOSCOPY WITH PROPOFOL;  Surgeon: Lucilla Lame, MD;  Location: [place];  Service: Endoscopy;  Laterality: N/A;   POLYPECTOMY N/A 04/22/2018   Procedure: POLYPECTOMY;  Surgeon: Lucilla Lame, MD;  Location: [place];  Service: Endoscopy;  Laterality: N/A;   TUBAL LIGATION      OB History   No obstetric history on file.      Home Medications    Prior to Admission medications   Medication Sig Start Date End Date Taking? Authorizing Provider  acetaminophen (TYLENOL) 325 MG tablet Take by mouth. 07/08/20  Yes [provider]  amphetamine-dextroamphetamine (ADDERALL XR) 20 MG 24 hr capsule Take 1 capsule (20 mg total) by mouth daily. 04/30/21  Yes McElwee, Lauren A, NP  citalopram (CELEXA) 20 MG tablet Take 1 tablet (20 mg total) by mouth daily. 07/28/19  Yes Delsa Grana, PA-C  fluticasone (FLONASE) 50 MCG/ACT nasal spray Place 2 sprays into the nose as needed. 12/08/17 05/08/21 Yes [provider]  levothyroxine (SYNTHROID) 100 MCG tablet Take 1 tablet (100 mcg total) by mouth daily. 01/07/21  Yes McElwee, Lauren A, NP  loratadine (CLARITIN) 10 MG tablet Take 10 mg by mouth daily.   Yes  [provider]  meloxicam (MOBIC) 7.5 MG tablet meloxicam 7.5 mg tablet  TAKE 1 TO 2 TABLETS BY MOUTH AS DIRECTED FOR ARTHRITIS 02/08/20  Yes [provider]  predniSONE (STERAPRED UNI-PAK 21 TAB) 10 MG (21) TBPK tablet Take by mouth daily. Take 6 tabs by mouth daily  for 2 days, then 5 tabs for 2 days, then 4 tabs for 2 days, then 3 tabs for 2 days, 2 tabs for 2 days, then 1 tab by mouth daily for 2 days 05/08/21  Yes Marney Setting, NP  valACYclovir (VALTREX) 1000 MG tablet Take 1 tablet (1,000 mg total) by mouth 3 (three) times daily for 14  days. 05/08/21 05/22/21 Yes Marney Setting, NP  nitrofurantoin, macrocrystal-monohydrate, (MACROBID) 100 MG capsule Take 1 capsule (100 mg total) by mouth 2 (two) times daily. 02/03/21   McElwee, Scheryl Darter, NP    Family History Family History  Problem Relation Age of Onset   Heart failure Mother    Lung cancer Mother    Heart disease Mother    Stroke Father    Heart attack Father    Hypertension Sister    Hypertension Brother    Arrhythmia Daughter        SVT   Heart disease Paternal Grandmother    Emphysema Paternal Grandfather    Hypertension Sister    Prostate cancer Neg Hx    Kidney cancer Neg Hx    Breast cancer Neg Hx     Social History Social History   Tobacco Use   Smoking status: Former    Packs/day: 1.00    Years: 8.00    Pack years: 8.00    Types: Cigarettes    Start date: 08/10/1977    Quit date: 08/1985    Years since quitting: 35.7   Smokeless tobacco: Never  Vaping Use   Vaping Use: Never used  Substance Use Topics   Alcohol use: Yes    Alcohol/week: 1.0 standard drink    Types: 1 Glasses of wine per week   Drug use: No     Allergies   Patient has no known allergies.   Review of Systems Review of Systems  Constitutional:  Negative for activity change, chills, fatigue and fever.  HENT:  Negative for congestion, postnasal drip, rhinorrhea, sinus pressure, sinus pain, sneezing and sore throat.        Lt eye clear drainage and facial droop   Eyes:  Positive for discharge and redness. Negative for photophobia, pain, itching and visual disturbance.  Respiratory: Negative.    Cardiovascular: Negative.   Gastrointestinal: Negative.   Genitourinary: Negative.   Neurological:  Positive for facial asymmetry. Negative for dizziness, syncope, speech difficulty, weakness, light-headedness, numbness and headaches.    Physical Exam Triage Vital Signs ED Triage Vitals  Enc Vitals Group     BP 05/08/21 0958 (!) 141/95     Pulse Rate 05/08/21 0958 79      Resp 05/08/21 0958 18     Temp 05/08/21 0958 98.5 F (36.9 C)     Temp Source 05/08/21 0958 Oral     SpO2 05/08/21 0958 97 %     Weight 05/08/21 0955 196 lb (88.9 kg)     Height 05/08/21 0955 5\' 7"  (1.702 m)     Head Circumference --      Peak Flow --      Pain Score 05/08/21 0955 0     Pain Loc --      Pain Edu? --  Excl. in GC? --    No data found.  Updated Vital Signs BP (!) 141/95 (BP Location: Left Arm)   Pulse 79   Temp 98.5 F (36.9 C) (Oral)   Resp 18   Ht 5\' 7"  (1.702 m)   Wt 196 lb (88.9 kg)   SpO2 97%   BMI 30.70 kg/m   Visual Acuity Right Eye Distance:   Left Eye Distance:   Bilateral Distance:    Right Eye Near:   Left Eye Near:    Bilateral Near:     Physical Exam Constitutional:      General: She is not in acute distress.    Appearance: Normal appearance. She is not ill-appearing.  HENT:     Right Ear: Tympanic membrane normal.     Left Ear: Tympanic membrane normal.     Nose: No congestion or rhinorrhea.     Mouth/Throat:     Mouth: Mucous membranes are moist.     Pharynx: No posterior oropharyngeal erythema.  Eyes:     General:        Left eye: Discharge present.    Extraocular Movements: Extraocular movements intact.     Conjunctiva/sclera:     Left eye: No exudate.    Pupils: Pupils are equal, round, and reactive to light.     Comments: Lt conjunctiva erythema clear drainage   Cardiovascular:     Rate and Rhythm: Normal rate.  Pulmonary:     Effort: Pulmonary effort is normal.  Abdominal:     General: Abdomen is flat.  Musculoskeletal:     Cervical back: Normal range of motion.  Skin:    General: Skin is warm.  Neurological:     Mental Status: She is alert.     Comments: Weakness with lt side facial droop, no neuro deficit, alert x4,      UC Treatments / Results  Labs (all labs ordered are listed, but only abnormal results are displayed) Labs Reviewed - No data to display  EKG   Radiology No results  found.  Procedures Procedures (including critical care time)  Medications Ordered in UC Medications  methylPREDNISolone sodium succinate (SOLU-MEDROL) 125 mg/2 mL injection 125 mg (125 mg Intramuscular Given 05/08/21 1021)    Initial Impression / Assessment and Plan / UC Course  I have reviewed the triage vital signs and the nursing notes.  Pertinent labs & imaging results that were available during my care of the patient were reviewed by me and considered in my medical decision making (see chart for details).     Use saline drops if eye becomes to dry out  Currently able to open and close lid if not will need a patch to cover eye Will need to see pcp this week  Go to ER if symptoms become worse or you notice any weakness to extremities.    Final Clinical Impressions(s) / UC Diagnoses   Final diagnoses:  Facial droop  Bell palsy  Eye drainage     Discharge Instructions      Use saline drops if eye becomes to dry out  Currently able to open and close lid if not will need a patch to cover eye Will need to see pcp this week  Go to ER if symptoms become worse or you notice any weakness to extremities.     ED Prescriptions     Medication Sig Dispense Auth. Provider   predniSONE (STERAPRED UNI-PAK 21 TAB) 10 MG (21) TBPK tablet Take by mouth  daily. Take 6 tabs by mouth daily  for 2 days, then 5 tabs for 2 days, then 4 tabs for 2 days, then 3 tabs for 2 days, 2 tabs for 2 days, then 1 tab by mouth daily for 2 days 42 tablet Morley Kos L, NP   valACYclovir (VALTREX) 1000 MG tablet Take 1 tablet (1,000 mg total) by mouth 3 (three) times daily for 14 days. 42 tablet Marney Setting, NP      PDMP not reviewed this encounter.   Marney Setting, NP 05/08/21 1026

## 2021-05-08 NOTE — ED Triage Notes (Signed)
Pt c/o onset of left-sided facial tingling and drooping since last night. Pt thinks she may have Bell's Palsy. Pt denies any other neurological symptoms, radiating pain or other symptoms.

## 2021-05-08 NOTE — Telephone Encounter (Signed)
Reason for Disposition . Bell's palsy suspected (i.e., weakness on only one side of the face, developing over hours to days, no other symptoms)  Answer Assessment - Initial Assessment Questions 1. SYMPTOM: "What is the main symptom you are concerned about?" (e.g., weakness, numbness)     Left side of mouth drooping 2. ONSET: "When did this start?" (minutes, hours, days; while sleeping)     LAst night 3. LAST NORMAL: "When was the last time you (the patient) were normal (no symptoms)?"     7pm 4. PATTERN "Does this come and go, or has it been constant since it started?"  "Is it present now?"     constant 5. CARDIAC SYMPTOMS: "Have you had any of the following symptoms: chest pain, difficulty breathing, palpitations?"     no 6. NEUROLOGIC SYMPTOMS: "Have you had any of the following symptoms: headache, dizziness, vision loss, double vision, changes in speech, unsteady on your feet?"     no 7. OTHER SYMPTOMS: "Do you have any other symptoms?"     Left eye watering, can't suck on straw 8. PREGNANCY: "Is there any chance you are pregnant?" "When was your last menstrual period?"     no  Protocols used: Neurologic Deficit-A-AH

## 2021-05-08 NOTE — Telephone Encounter (Signed)
Pt called, is going to UC. Appt cancelled by agent.

## 2021-05-12 ENCOUNTER — Encounter: Payer: 59 | Admitting: Nurse Practitioner

## 2021-05-19 ENCOUNTER — Other Ambulatory Visit: Payer: Self-pay | Admitting: Nurse Practitioner

## 2021-05-22 NOTE — Progress Notes (Signed)
BP 137/70   Pulse 73   Temp 98.3 F (36.8 C) (Oral)   Ht 5' 6.9" (1.699 m)   Wt 199 lb 6.4 oz (90.4 kg)   SpO2 98%   BMI 31.32 kg/m    Subjective:    Patient ID: Jennifer Whitney, female    DOB: 1961/07/16, 60 y.o.   MRN: [id number]  CC: Chief Complaint  Patient presents with   Annual Exam    HPI: Jennifer Whitney is a 60 y.o. female presenting on 05/23/2021 for comprehensive medical examination. Current medical complaints include:none  ADHD FOLLOW UP  ADHD status: controlled Satisfied with current therapy: yes Medication compliance:  excellent compliance Controlled substance contract: yes Previous psychiatry evaluation: yes Previous medications: yes adderall XR   Taking meds on weekends/vacations: yes Work/school performance:  good Difficulty sustaining attention/completing tasks: no Distracted by extraneous stimuli: no Does not listen when spoken to: no  Fidgets with hands or feet: yes Unable to stay in seat: no Blurts out/interrupts others: no ADHD Medication Side Effects: no    Decreased appetite: no    Headache: no    Sleeping disturbance pattern: no    Irritability: no    Rebound effects (worse than baseline) off medication: no    Anxiousness: no    Dizziness: no    Tics: no  She currently lives with: husband Menopausal Symptoms: yes - hot flashes- has tried black cohosh   MENOPAUSAL SYMPTOMS  Duration: uncontrolled Symptom severity: moderate Hot flashes: yes Night sweats: yes Sleep disturbances: yes Vaginal dryness: no Dyspareunia: n/a Decreased libido: yes Emotional lability: yes Stress incontinence: no Previous HRT/pharmacotherapy: no Hysterectomy: yes GYN surgery: hysterectomy Absolute Contraindications to Hormonal Therapy:     Undiagnosed vaginal bleeding: no    Breast cancer: no    Endometrial cancer: no    Coronary disease: no    Cerebrovascular disease: no    Venous thromboembolic disease: no   Depression Screen done today and  results listed below:  Depression screen [facility] 2/9 05/23/2021 01/02/2021 11/18/2018 09/14/2018 07/19/2018  Decreased Interest 1 0 0 0 0  Down, Depressed, Hopeless 0 0 0 0 0  PHQ - 2 Score 1 0 0 0 0  Altered sleeping 2 1 0 0 0  Tired, decreased energy 0 3 0 0 1  Change in appetite 1 3 0 0 0  Feeling bad or failure about yourself  0 0 0 0 0  Trouble concentrating 0 0 0 0 0  Moving slowly or fidgety/restless 0 0 0 0 0  Suicidal thoughts 0 0 0 0 0  PHQ-9 Score 4 7 0 0 1  Difficult doing work/chores Not difficult at all Not difficult at all Not difficult at all Not difficult at all Not difficult at all    The patient does not have a history of falls. I did not complete a risk assessment for falls. A plan of care for falls was not documented.   Past Medical History:  Past Medical History:  Diagnosis Date   Acquired hypothyroidism 11/25/2015   ADHD    ADHD (attention deficit hyperactivity disorder), combined type 07/20/2018   Evaluation by Dr. 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    Surgical History:  Past Surgical History:  Procedure Laterality Date   Bladder Mesh  2014   COLONOSCOPY WITH PROPOFOL N/A 04/22/2018   Procedure: COLONOSCOPY WITH PROPOFOL;  Surgeon: Lucilla Lame, MD;  Location: [place];  Service: Endoscopy;  Laterality: N/A;   POLYPECTOMY N/A 04/22/2018   Procedure: POLYPECTOMY;  Surgeon: Lucilla Lame, MD;  Location: [place];  Service: Endoscopy;  Laterality: N/A;   TOTAL ABDOMINAL HYSTERECTOMY      TUBAL LIGATION      Medications:  Current Outpatient Medications on File Prior to Visit  Medication Sig   acetaminophen (TYLENOL) 325 MG tablet Take by mouth.   amphetamine-dextroamphetamine (ADDERALL XR) 20 MG 24 hr capsule Take 1 capsule (20 mg total) by mouth daily.   citalopram (CELEXA) 20 MG tablet Take 1 tablet (20 mg total) by mouth daily.   levothyroxine (SYNTHROID) 100 MCG tablet Take 1 tablet by mouth once daily   loratadine (CLARITIN) 10 MG tablet Take 10 mg by mouth daily.   meloxicam (MOBIC) 7.5 MG tablet meloxicam 7.5 mg tablet  TAKE 1 TO 2 TABLETS BY MOUTH AS DIRECTED FOR ARTHRITIS   fluticasone (FLONASE) 50 MCG/ACT nasal spray Place 2 sprays into the nose as needed.   No current facility-administered medications on file prior to visit.    Allergies:  No Known Allergies  Social History:  Social History   Socioeconomic History   Marital status: Married    Spouse name: Jori Moll   Number of children: 6   Years of education: Not on file   Highest education level: Associate degree: academic program  Occupational History   Occupation: PE Instructor  Tobacco Use   Smoking status: Former    Packs/day: 1.00    Years: 8.00    Pack years: 8.00    Types: Cigarettes    Start date: 08/10/1977    Quit date: 08/1985    Years since quitting: 35.8   Smokeless tobacco: Never  Vaping Use   Vaping Use: Never used  Substance and Sexual Activity   Alcohol use: Yes    Alcohol/week: 1.0 standard drink    Types: 1 Glasses of wine per week   Drug use: No   Sexual activity: Not Currently    Partners: Male  Other Topics Concern   Not on file  Social History Narrative   Not on file   Social Determinants of Health   Financial Resource Strain: Not on file  Food Insecurity: Not on file  Transportation Needs: Not on file  Physical Activity: Not on file  Stress: Not on file  Social Connections: Not on file  Intimate Partner Violence: Not on file   Social History   Tobacco  Use  Smoking Status Former   Packs/day: 1.00   Years: 8.00   Pack years: 8.00   Types: Cigarettes   Start date: 08/10/1977   Quit date: 08/1985   Years since quitting: 35.8  Smokeless Tobacco Never   Social History   Substance and Sexual Activity  Alcohol Use Yes   Alcohol/week: 1.0 standard drink   Types: 1 Glasses of wine per week    Family History:  Family History  Problem Relation Age of Onset   Heart failure Mother    Lung cancer Mother    Heart disease Mother  Stroke Father    Heart attack Father    Hypertension Sister    Hypertension Brother    Arrhythmia Daughter        SVT   Heart disease Paternal Grandmother    Emphysema Paternal Grandfather    Hypertension Sister    Prostate cancer Neg Hx    Kidney cancer Neg Hx    Breast cancer Neg Hx     Past medical history, surgical history, medications, allergies, family history and social history reviewed with patient today and changes made to appropriate areas of the chart.   Review of Systems  Constitutional:  Positive for malaise/fatigue.  HENT: Negative.    Eyes: Negative.   Respiratory: Negative.    Cardiovascular: Negative.   Gastrointestinal: Negative.   Genitourinary: Negative.        Hot flashes  Musculoskeletal:  Positive for back pain (chronic).  Skin: Negative.   Neurological: Negative.   Psychiatric/Behavioral: Negative.    All other ROS negative except what is listed above and in the HPI.      Objective:    BP 137/70   Pulse 73   Temp 98.3 F (36.8 C) (Oral)   Ht 5' 6.9" (1.699 m)   Wt 199 lb 6.4 oz (90.4 kg)   SpO2 98%   BMI 31.32 kg/m   Wt Readings from Last 3 Encounters:  05/23/21 199 lb 6.4 oz (90.4 kg)  05/08/21 196 lb (88.9 kg)  02/03/21 198 lb 3.2 oz (89.9 kg)    Physical Exam Vitals and nursing note reviewed.  Constitutional:      General: She is not in acute distress.    Appearance: Normal appearance.  HENT:     Head: Normocephalic and atraumatic.     Right Ear:  Tympanic membrane, ear canal and external ear normal.     Left Ear: Tympanic membrane, ear canal and external ear normal.     Nose: Nose normal.     Mouth/Throat:     Mouth: Mucous membranes are moist.     Pharynx: Oropharynx is clear.  Eyes:     Conjunctiva/sclera: Conjunctivae normal.  Cardiovascular:     Rate and Rhythm: Normal rate and regular rhythm.     Pulses: Normal pulses.     Heart sounds: Normal heart sounds.  Pulmonary:     Effort: Pulmonary effort is normal.     Breath sounds: Normal breath sounds.  Abdominal:     General: Bowel sounds are normal.     Palpations: Abdomen is soft.     Tenderness: There is no abdominal tenderness.  Musculoskeletal:        General: Normal range of motion.     Cervical back: Normal range of motion and neck supple. No tenderness.     Comments: Strength 5/5 in bilateral upper and lower extremities   Lymphadenopathy:     Cervical: No cervical adenopathy.  Skin:    General: Skin is warm and dry.  Neurological:     General: No focal deficit present.     Mental Status: She is alert and oriented to person, place, and time.     Cranial Nerves: No cranial nerve deficit.     Coordination: Coordination normal.     Gait: Gait normal.  Psychiatric:        Mood and Affect: Mood normal.        Behavior: Behavior normal.        Thought Content: Thought content normal.  Judgment: Judgment normal.    Results for orders placed or performed in visit on 03/04/21  TSH  Result Value Ref Range   TSH 3.180 0.450 - 4.500 uIU/mL      Assessment & Plan:   Problem List Items Addressed This Visit       Cardiovascular and Mediastinum   Menopausal syndrome (hot flashes)    Chronic, ongoing. Has tried black cohosh over the counter with no relief. She is interested in starting hormonal therapy for her symptoms. She does not have a history of MI, stroke, cancer, unexplained vaginal bleeding, or blood clots. Will start Climara patch 0.025mg  weekly.  Follow-up in 4 weeks.        Other   Depression, major, single episode, complete remission (HCC)    Chronic, stable. Continue celexa daily. No refills needed at this time. Follow up in 6 months.       Attention deficit hyperactivity disorder (ADHD) - Primary    Chronic, stable. Controlled with adderall XR 20mg  daily. Controlled substance contract in place. Risks and benefits discussed and she would like to continue taking the medication. Does not need refills today. Follow up in 3 months.       Mixed hyperlipidemia    Check lipid panel today and treat as needed.       Relevant Orders   Lipid Panel w/o Chol/HDL Ratio   Other Visit Diagnoses     Routine general medical examination at a health care facility       Health maintenance reviewed and updated. Instructed her to call and make appointment for mammogram. Check CMP and CBC today.   Relevant Orders   CBC with Differential/Platelet   Comprehensive metabolic panel   Screening for HIV (human immunodeficiency virus)       Screen for HIV today   Relevant Orders   HIV Antibody (routine testing w rflx)   Encounter for hepatitis C screening test for low risk patient       screen for hepatitis C today   Relevant Orders   Hepatitis C Antibody   Need for influenza vaccination       Flu vaccine given today   Relevant Orders   Flu Vaccine QUAD 52mo+IM (Fluarix, Fluzone & Alfiuria Quad PF) (Completed)        Follow up plan: Return in about 4 weeks (around 06/20/2021) for hot flashes.   LABORATORY TESTING:  - Pap smear: not applicable  IMMUNIZATIONS:   - Tdap: Tetanus vaccination status reviewed: last tetanus booster within 10 years. - Influenza: Administered today - Pneumovax: Not applicable - Prevnar: Not applicable - HPV: Not applicable - Zostavax vaccine:  she will reach out to her insurance company to see where it is covered  SCREENING: -Mammogram:  ordered in May 2022, discussed scheduling appointment   -  Colonoscopy: Up to date  - Bone Density: Not applicable  -Hearing Test: Not applicable  -Spirometry: Not applicable   PATIENT COUNSELING:   Advised to take 1 mg of folate supplement per day if capable of pregnancy.   Sexuality: Discussed sexually transmitted diseases, partner selection, use of condoms, avoidance of unintended pregnancy  and contraceptive alternatives.   Advised to avoid cigarette smoking.  I discussed with the patient that most people either abstain from alcohol or drink within safe limits (<=14/week and <=4 drinks/occasion for males, <=7/weeks and <= 3 drinks/occasion for females) and that the risk for alcohol disorders and other health effects rises proportionally with the number of drinks per week  and how often a drinker exceeds daily limits.  Discussed cessation/primary prevention of drug use and availability of treatment for abuse.   Diet: Encouraged to adjust caloric intake to maintain  or achieve ideal body weight, to reduce intake of dietary saturated fat and total fat, to limit sodium intake by avoiding high sodium foods and not adding table salt, and to maintain adequate dietary potassium and calcium preferably from fresh fruits, vegetables, and low-fat dairy products.    stressed the importance of regular exercise  Injury prevention: Discussed safety belts, safety helmets, smoke detector, smoking near bedding or upholstery.   Dental health: Discussed importance of regular tooth brushing, flossing, and dental visits.    NEXT PREVENTATIVE PHYSICAL DUE IN 1 YEAR. Return in about 4 weeks (around 06/20/2021) for hot flashes.

## 2021-05-23 ENCOUNTER — Encounter: Payer: Self-pay | Admitting: Nurse Practitioner

## 2021-05-23 ENCOUNTER — Ambulatory Visit (INDEPENDENT_AMBULATORY_CARE_PROVIDER_SITE_OTHER): Payer: 59 | Admitting: Nurse Practitioner

## 2021-05-23 ENCOUNTER — Other Ambulatory Visit: Payer: Self-pay

## 2021-05-23 VITALS — BP 137/70 | HR 73 | Temp 98.3°F | Ht 66.9 in | Wt 199.4 lb

## 2021-05-23 DIAGNOSIS — Z0000 Encounter for general adult medical examination without abnormal findings: Secondary | ICD-10-CM | POA: Diagnosis not present

## 2021-05-23 DIAGNOSIS — E782 Mixed hyperlipidemia: Secondary | ICD-10-CM

## 2021-05-23 DIAGNOSIS — Z23 Encounter for immunization: Secondary | ICD-10-CM | POA: Diagnosis not present

## 2021-05-23 DIAGNOSIS — F325 Major depressive disorder, single episode, in full remission: Secondary | ICD-10-CM

## 2021-05-23 DIAGNOSIS — Z114 Encounter for screening for human immunodeficiency virus [HIV]: Secondary | ICD-10-CM

## 2021-05-23 DIAGNOSIS — F909 Attention-deficit hyperactivity disorder, unspecified type: Secondary | ICD-10-CM

## 2021-05-23 DIAGNOSIS — G510 Bell's palsy: Secondary | ICD-10-CM

## 2021-05-23 DIAGNOSIS — Z1159 Encounter for screening for other viral diseases: Secondary | ICD-10-CM

## 2021-05-23 DIAGNOSIS — N951 Menopausal and female climacteric states: Secondary | ICD-10-CM

## 2021-05-23 MED ORDER — ESTRADIOL 0.025 MG/24HR TD PTWK: 0.0250 mg | MEDICATED_PATCH | TRANSDERMAL | 1 refills | Status: DC

## 2021-05-23 NOTE — Assessment & Plan Note (Addendum)
Chronic, stable. Controlled with adderall XR 20mg  daily. Controlled substance contract in place. Risks and benefits discussed and she would like to continue taking the medication. Does not need refills today. Follow up in 3 months.

## 2021-05-23 NOTE — Assessment & Plan Note (Signed)
Chronic, stable. Continue celexa daily. No refills needed at this time. Follow up in 6 months.

## 2021-05-23 NOTE — Assessment & Plan Note (Signed)
Check lipid panel today and treat as needed.

## 2021-05-23 NOTE — Patient Instructions (Signed)
[facility]  Address: [address], [place], [place] [address]  Phone: [phone]  

## 2021-05-23 NOTE — Assessment & Plan Note (Signed)
Chronic, ongoing. Has tried black cohosh over the counter with no relief. She is interested in starting hormonal therapy for her symptoms. She does not have a history of MI, stroke, cancer, unexplained vaginal bleeding, or blood clots. Will start Climara patch 0.025mg  weekly. Follow-up in 4 weeks.

## 2021-05-24 LAB — COMPREHENSIVE METABOLIC PANEL
ALT: 20 IU/L (ref 0–32)
AST: 15 IU/L (ref 0–40)
Albumin/Globulin Ratio: 2.4 — ABNORMAL HIGH (ref 1.2–2.2)
Albumin: 4.3 g/dL (ref 3.8–4.9)
Alkaline Phosphatase: 104 IU/L (ref 44–121)
BUN/Creatinine Ratio: 16 (ref 9–23)
BUN: 14 mg/dL (ref 6–24)
Bilirubin Total: 0.3 mg/dL (ref 0.0–1.2)
CO2: 23 mmol/L (ref 20–29)
Calcium: 9 mg/dL (ref 8.7–10.2)
Chloride: 106 mmol/L (ref 96–106)
Creatinine, Ser: 0.86 mg/dL (ref 0.57–1.00)
Globulin, Total: 1.8 g/dL (ref 1.5–4.5)
Glucose: 88 mg/dL (ref 70–99)
Potassium: 4.3 mmol/L (ref 3.5–5.2)
Sodium: 142 mmol/L (ref 134–144)
Total Protein: 6.1 g/dL (ref 6.0–8.5)
eGFR: 78 mL/min/{1.73_m2} (ref >59)

## 2021-05-24 LAB — CBC WITH DIFFERENTIAL/PLATELET
Basophils Absolute: 0.1 10*3/uL (ref 0.0–0.2)
Basos: 1 %
EOS (ABSOLUTE): 0.2 10*3/uL (ref 0.0–0.4)
Eos: 3 %
Hematocrit: 43.4 % (ref 34.0–46.6)
Hemoglobin: 14.6 g/dL (ref 11.1–15.9)
Immature Grans (Abs): 0 10*3/uL (ref 0.0–0.1)
Immature Granulocytes: 0 %
Lymphocytes Absolute: 1.1 10*3/uL (ref 0.7–3.1)
Lymphs: 20 %
MCH: 33.2 pg — ABNORMAL HIGH (ref 26.6–33.0)
MCHC: 33.6 g/dL (ref 31.5–35.7)
MCV: 99 fL — ABNORMAL HIGH (ref 79–97)
Monocytes Absolute: 0.6 10*3/uL (ref 0.1–0.9)
Monocytes: 11 %
Neutrophils Absolute: 3.5 10*3/uL (ref 1.4–7.0)
Neutrophils: 65 %
Platelets: 232 10*3/uL (ref 150–450)
RBC: 4.4 x10E6/uL (ref 3.77–5.28)
RDW: 12.7 % (ref 11.7–15.4)
WBC: 5.4 10*3/uL (ref 3.4–10.8)

## 2021-05-24 LAB — LIPID PANEL W/O CHOL/HDL RATIO
Cholesterol, Total: 209 mg/dL — ABNORMAL HIGH (ref 100–199)
HDL: 41 mg/dL (ref >39)
LDL Chol Calc (NIH): 136 mg/dL — ABNORMAL HIGH (ref 0–99)
Triglycerides: 180 mg/dL — ABNORMAL HIGH (ref 0–149)
VLDL Cholesterol Cal: 32 mg/dL (ref 5–40)

## 2021-05-24 LAB — HIV ANTIBODY (ROUTINE TESTING W REFLEX): HIV Screen 4th Generation wRfx: NONREACTIVE

## 2021-05-24 LAB — HEPATITIS C ANTIBODY: Hep C Virus Ab: <0.1 s/co ratio (ref 0.0–0.9)

## 2021-06-10 ENCOUNTER — Other Ambulatory Visit: Payer: Self-pay | Admitting: Nurse Practitioner

## 2021-06-10 MED ORDER — AMPHETAMINE-DEXTROAMPHET ER 20 MG PO CP24: 20.0000 mg | ORAL_CAPSULE | Freq: Every day | ORAL | 0 refills | Status: DC

## 2021-06-10 NOTE — Telephone Encounter (Signed)
Copied from [place] [id number]. Topic: Quick Communication - Rx Refill/Question >> Jun 10, 2021 11:30 AM Yvette Rack wrote: Medication: amphetamine-dextroamphetamine (ADDERALL XR) 20 MG 24 hr capsule  Has the patient contacted their pharmacy? No. Previously advised by pharmacy that controlled substances go through provider  (Agent: If no, request that the patient contact the pharmacy for the refill. If patient does not wish to contact the pharmacy document the reason why and proceed with request.) (Agent: If yes, when and what did the pharmacy advise?)  Preferred Pharmacy (with phone number or street name): [facility] [place] - [address] Phone: [phone]  Fax: [phone]  Has the patient been seen for an appointment in the last year OR does the patient have an upcoming appointment? Yes.    Agent: Please be advised that RX refills may take up to 3 business days. We ask that you follow-up with your pharmacy.

## 2021-06-10 NOTE — Telephone Encounter (Signed)
Please advise as needed.

## 2021-06-10 NOTE — Telephone Encounter (Signed)
Requested medication (s) are due for refill today:   Provider to review  Requested medication (s) are on the active medication list:   Yes  Future visit scheduled:   Yes   Last ordered: 04/30/2021 #30, 0 refills  Non delegated refill per protocol   Requested Prescriptions  Pending Prescriptions Disp Refills   amphetamine-dextroamphetamine (ADDERALL XR) 20 MG 24 hr capsule 30 capsule 0    Sig: Take 1 capsule (20 mg total) by mouth daily.     Not Delegated - Psychiatry:  Stimulants/ADHD Failed - 06/10/2021 11:56 AM      Failed - This refill cannot be delegated      Failed - Urine Drug Screen completed in last 360 days      Passed - Valid encounter within last 3 months    Recent Outpatient Visits           2 weeks ago Attention deficit hyperactivity disorder (ADHD), unspecified ADHD type   [place], Lauren A, NP   4 months ago Right low back pain, unspecified chronicity, unspecified whether sciatica present   Hunter, Lauren A, NP   5 months ago Attention deficit hyperactivity disorder (ADHD), unspecified ADHD type   Martin, Lauren A, NP   2 years ago Allergic rhinitis, unspecified seasonality, unspecified trigger   [facility] Lada, Satira Anis, MD   2 years ago Acquired hypothyroidism   [place]man, Satira Anis, MD       Future Appointments             In 2 weeks McElwee, Scheryl Darter, NP [facility]

## 2021-06-26 NOTE — Progress Notes (Signed)
Established Patient Office Visit  Subjective:  Patient ID: Jennifer Whitney, female    DOB: 05/07/1961  Age: 60 y.o. MRN: [id number]  CC:  Chief Complaint  Patient presents with   Hot Flashes    HPI Jennifer Whitney presents for follow up on hot flashes. She was started on Climara patch 0.042m weekly. Not noticing any difference hot flashes and sweating. Denies side effects, but not noticing any relief. Still with sweating, slightly less since the weather has cooled down.   Past Medical History:  Diagnosis Date   Acquired hypothyroidism 11/25/2015   ADHD    ADHD (attention deficit hyperactivity disorder), combined type 07/20/2018   Evaluation by Dr. 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    Past Surgical History:  Procedure Laterality Date   Bladder Mesh  2014   COLONOSCOPY WITH PROPOFOL N/A 04/22/2018   Procedure: COLONOSCOPY WITH PROPOFOL;  Surgeon: WLucilla Lame MD;  [place]  Service: Endoscopy;  Laterality: N/A;   POLYPECTOMY N/A 04/22/2018   Procedure: POLYPECTOMY;  Surgeon: WLucilla Lame MD;  [place]  Service: Endoscopy;  Laterality: N/A;   TOTAL ABDOMINAL HYSTERECTOMY     TUBAL LIGATION      Family History  Problem Relation Age of Onset   Heart failure Mother    Lung cancer Mother    Heart disease Mother    Stroke Father    Heart attack Father    Hypertension Sister    Hypertension Brother    Arrhythmia Daughter        SVT   Heart disease Paternal Grandmother     Emphysema Paternal Grandfather    Hypertension Sister    Prostate cancer Neg Hx    Kidney cancer Neg Hx    Breast cancer Neg Hx     Social History   Socioeconomic History   Marital status: Married    Spouse name: RJori Moll  Number of children: 6   Years of education: Not on file   Highest education level: Associate degree: academic program  Occupational History   Occupation: PE Instructor  Tobacco Use   Smoking status: Former    Packs/day: 1.00    Years: 8.00    Pack years: 8.00    Types: Cigarettes    Start date: 08/10/1977    Quit date: 08/1985    Years since quitting: 35.9   Smokeless tobacco: Never  Vaping Use   Vaping Use: Never used  Substance and Sexual Activity   Alcohol use: Yes    Alcohol/week: 1.0 standard drink    Types: 1 Glasses of wine per week   Drug use: No   Sexual activity: Not Currently    Partners: Male  Other Topics Concern   Not on file  Social History Narrative   Not on file   Social Determinants of Health   Financial Resource Strain: Not on file  Food Insecurity: Not on file  Transportation Needs: Not on file  Physical Activity: Not on file  Stress: Not on file  Social Connections: Not on file  Intimate Partner Violence: Not on file    Outpatient Medications Prior to Visit  Medication Sig Dispense Refill   amphetamine-dextroamphetamine (ADDERALL XR) 20 MG 24 hr capsule Take 1 capsule (20 mg total) by mouth daily. 30 capsule 0   citalopram (CELEXA) 20 MG tablet Take 1 tablet (20 mg total) by mouth daily. 90 tablet 0   levothyroxine (SYNTHROID) 100 MCG tablet Take 1 tablet by mouth once daily 90 tablet 0   loratadine (CLARITIN) 10 MG tablet Take 10 mg by mouth daily.     meloxicam (MOBIC) 7.5 MG tablet meloxicam 7.5 mg tablet  TAKE 1 TO 2 TABLETS BY MOUTH AS DIRECTED FOR ARTHRITIS     estradiol (CLIMARA - DOSED IN MG/24 HR) 0.025 mg/24hr patch Place 1 patch (0.025 mg total) onto the skin once a week. 4 patch 1   acetaminophen (TYLENOL)  325 MG tablet Take by mouth.     fluticasone (FLONASE) 50 MCG/ACT nasal spray Place 2 sprays into the nose as needed.     No facility-administered medications prior to visit.    No Known Allergies  ROS Review of Systems  Constitutional:  Positive for fatigue.  Respiratory: Negative.    Cardiovascular: Negative.   Gastrointestinal:  Positive for abdominal pain (heart burn) and constipation.  Genitourinary:        Hot flashes  Skin: Negative.   Neurological: Negative.      Objective:    Physical Exam Vitals and nursing note reviewed.  Constitutional:      General: She is not in acute distress.    Appearance: Normal appearance.  HENT:     Head: Normocephalic and atraumatic.  Eyes:     Conjunctiva/sclera: Conjunctivae normal.  Cardiovascular:     Rate and Rhythm: Normal rate and regular rhythm.     Pulses: Normal pulses.     Heart sounds: Normal heart sounds.  Pulmonary:     Effort: Pulmonary effort is normal.     Breath sounds: Normal breath sounds.  Musculoskeletal:     Cervical back: Normal range of motion.  Skin:    General: Skin is warm and dry.  Neurological:     General: No focal deficit present.     Mental Status: She is alert and oriented to person, place, and time.  Psychiatric:        Mood and Affect: Mood normal.        Behavior: Behavior normal.        Thought Content: Thought content normal.        Judgment: Judgment normal.    BP 138/89 (BP Location: Left Arm, Patient Position: Sitting)   Pulse 67   Temp 98.1 F (36.7 C) (Oral)   SpO2 98%  Wt Readings from Last 3 Encounters:  05/23/21 199 lb 6.4 oz (90.4 kg)  05/08/21 196 lb (88.9 kg)  02/03/21 198 lb 3.2 oz (89.9 kg)     Health Maintenance Due  Topic Date Due   COVID-19 Vaccine (1) Never done   Zoster Vaccines- Shingrix (1 of 2) Never done   MAMMOGRAM  03/11/2019    There are no preventive care reminders to display for this patient.  Lab Results  Component Value Date   TSH 3.180  03/04/2021   Lab Results  Component Value Date   WBC 5.4 05/23/2021   HGB 14.6 05/23/2021   HCT 43.4 05/23/2021   MCV 99 (H) 05/23/2021   PLT 232  05/23/2021   Lab Results  Component Value Date   NA 142 05/23/2021   K 4.3 05/23/2021   CO2 23 05/23/2021   GLUCOSE 88 05/23/2021   BUN 14 05/23/2021   CREATININE 0.86 05/23/2021   BILITOT 0.3 05/23/2021   ALKPHOS 104 05/23/2021   AST 15 05/23/2021   ALT 20 05/23/2021   PROT 6.1 05/23/2021   ALBUMIN 4.3 05/23/2021   CALCIUM 9.0 05/23/2021   ANIONGAP 4 (L) 01/05/2014   EGFR 78 05/23/2021   Lab Results  Component Value Date   CHOL 209 (H) 05/23/2021   Lab Results  Component Value Date   HDL 41 05/23/2021   Lab Results  Component Value Date   LDLCALC 136 (H) 05/23/2021   Lab Results  Component Value Date   TRIG 180 (H) 05/23/2021   Lab Results  Component Value Date   CHOLHDL 5.6 (H) 09/14/2018   No results found for: HGBA1C    Assessment & Plan:   Problem List Items Addressed This Visit       Cardiovascular and Mediastinum   Menopausal syndrome (hot flashes) - Primary    Ongoing, no relief with estrogen patch. Discussed various options including increasing estrogen patch dose, or changing to clonidine or gabapentin. She opted to start clonidine patch weekly. Prescription sent to the pharmacy. Follow up in 4 weeks, can be virtual.       Relevant Medications   cloNIDine (CATAPRES - DOSED IN MG/24 HR) 0.1 mg/24hr patch    Meds ordered this encounter  Medications   cloNIDine (CATAPRES - DOSED IN MG/24 HR) 0.1 mg/24hr patch    Sig: Place 1 patch (0.1 mg total) onto the skin once a week.    Dispense:  4 patch    Refill:  1    Follow-up: Return in about 4 weeks (around 07/25/2021) for hot flashes, virtual.    Charyl Dancer, NP

## 2021-06-27 ENCOUNTER — Other Ambulatory Visit: Payer: Self-pay

## 2021-06-27 ENCOUNTER — Encounter: Payer: Self-pay | Admitting: Nurse Practitioner

## 2021-06-27 ENCOUNTER — Ambulatory Visit: Payer: 59 | Admitting: Nurse Practitioner

## 2021-06-27 ENCOUNTER — Ambulatory Visit (INDEPENDENT_AMBULATORY_CARE_PROVIDER_SITE_OTHER): Payer: 59 | Admitting: Nurse Practitioner

## 2021-06-27 VITALS — BP 138/89 | HR 67 | Temp 98.1°F

## 2021-06-27 DIAGNOSIS — N951 Menopausal and female climacteric states: Secondary | ICD-10-CM

## 2021-06-27 MED ORDER — CLONIDINE 0.1 MG/24HR TD PTWK: 0.1000 mg | MEDICATED_PATCH | TRANSDERMAL | 1 refills | Status: DC

## 2021-06-27 NOTE — Assessment & Plan Note (Signed)
Ongoing, no relief with estrogen patch. Discussed various options including increasing estrogen patch dose, or changing to clonidine or gabapentin. She opted to start clonidine patch weekly. Prescription sent to the pharmacy. Follow up in 4 weeks, can be virtual.

## 2021-06-27 NOTE — Patient Instructions (Signed)
[facility]  Address: [address], [place], [place] [address]  Phone: [phone]  

## 2021-07-25 ENCOUNTER — Encounter: Payer: Self-pay | Admitting: Nurse Practitioner

## 2021-07-25 ENCOUNTER — Ambulatory Visit (INDEPENDENT_AMBULATORY_CARE_PROVIDER_SITE_OTHER): Payer: 59 | Admitting: Nurse Practitioner

## 2021-07-25 DIAGNOSIS — N951 Menopausal and female climacteric states: Secondary | ICD-10-CM

## 2021-07-25 MED ORDER — AMPHETAMINE-DEXTROAMPHET ER 20 MG PO CP24: 20.0000 mg | ORAL_CAPSULE | Freq: Every day | ORAL | 0 refills | Status: DC

## 2021-07-25 MED ORDER — GABAPENTIN 300 MG PO CAPS: 300.0000 mg | ORAL_CAPSULE | Freq: Two times a day (BID) | ORAL | 1 refills | Status: DC

## 2021-07-25 NOTE — Assessment & Plan Note (Signed)
Ongoing, not controlled.  Unable to afford the clonidine patch.  We will start gabapentin 300 mg at bedtime.  After 3 to 4 days if she is not noticing improvement she can increase this to 300 mg twice a day.  Follow-up in 1 month.  She states that if she cannot afford this medication or does not work, she may just live with the hot flashes.  She has tried estrogen patches and black cohosh over-the-counter without relief.

## 2021-07-25 NOTE — Progress Notes (Signed)
Established Patient Office Visit  Subjective:  Patient ID: Jennifer Whitney, female    DOB: 1960/09/21  Age: 60 y.o. MRN: [id number]  CC:  Chief Complaint  Patient presents with   Hot Flashes    1 month f/up- pt states she did not start medication prescribed due to the price    HPI Jennifer Whitney presents for follow up on hot flashes. She states that she didn't pick up the clonidine patch because it was too expensive. She has stopped the estrogen patch as well. She still endorses frequent hot flashes. She is requesting another medication that may help, that may be less expensive.   Past Medical History:  Diagnosis Date   Acquired hypothyroidism 11/25/2015   ADHD    ADHD (attention deficit hyperactivity disorder), combined type 07/20/2018   Evaluation by Dr. 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    Past Surgical History:  Procedure Laterality Date   Bladder Mesh  2014   COLONOSCOPY WITH PROPOFOL N/A 04/22/2018   Procedure: COLONOSCOPY WITH PROPOFOL;  Surgeon: Midge Minium, MD;  Location: [facility];  Service: Endoscopy;  Laterality: N/A;   POLYPECTOMY N/A 04/22/2018   Procedure: POLYPECTOMY;  Surgeon: Midge Minium, MD;  Location: [facility];  Service: Endoscopy;  Laterality: N/A;   TOTAL ABDOMINAL HYSTERECTOMY     TUBAL LIGATION      Family History  Problem Relation Age of Onset   Heart failure Mother    Lung cancer Mother    Heart disease Mother    Stroke Father    Heart attack Father    Hypertension  Sister    Hypertension Brother    Arrhythmia Daughter        SVT   Heart disease Paternal Grandmother    Emphysema Paternal Grandfather    Hypertension Sister    Prostate cancer Neg Hx    Kidney cancer Neg Hx    Breast cancer Neg Hx     Social History   Socioeconomic History   Marital status: Married    Spouse name: Windy Fast   Number of children: 6   Years of education: Not on file   Highest education level: Associate degree: academic program  Occupational History   Occupation: PE Instructor  Tobacco Use   Smoking status: Former    Packs/day: 1.00    Years: 8.00    Pack years: 8.00    Types: Cigarettes    Start date: 08/10/1977    Quit date: 08/1985    Years since quitting: 35.9   Smokeless tobacco: Never  Vaping Use   Vaping Use: Never used  Substance and Sexual Activity   Alcohol use: Yes    Alcohol/week: 1.0 standard drink    Types: 1 Glasses of wine per week   Drug use: No   Sexual activity: Not Currently    Partners: Male  Other Topics Concern   Not on file  Social History Narrative   Not on file   Social Determinants of Health  Financial Resource Strain: Not on file  Food Insecurity: Not on file  Transportation Needs: Not on file  Physical Activity: Not on file  Stress: Not on file  Social Connections: Not on file  Intimate Partner Violence: Not on file    Outpatient Medications Prior to Visit  Medication Sig Dispense Refill   acetaminophen (TYLENOL) 325 MG tablet Take by mouth.     citalopram (CELEXA) 20 MG tablet Take 1 tablet (20 mg total) by mouth daily. 90 tablet 0   levothyroxine (SYNTHROID) 100 MCG tablet Take 1 tablet by mouth once daily 90 tablet 0   loratadine (CLARITIN) 10 MG tablet Take 10 mg by mouth daily.     meloxicam (MOBIC) 7.5 MG tablet meloxicam 7.5 mg tablet  TAKE 1 TO 2 TABLETS BY MOUTH AS DIRECTED FOR ARTHRITIS     amphetamine-dextroamphetamine (ADDERALL XR) 20 MG 24 hr capsule Take 1 capsule (20 mg total) by mouth daily. 30  capsule 0   fluticasone (FLONASE) 50 MCG/ACT nasal spray Place 2 sprays into the nose as needed.     cloNIDine (CATAPRES - DOSED IN MG/24 HR) 0.1 mg/24hr patch Place 1 patch (0.1 mg total) onto the skin once a week. (Patient not taking: Reported on 07/25/2021) 4 patch 1   No facility-administered medications prior to visit.    No Known Allergies  ROS Review of Systems  Constitutional:  Positive for fatigue.       Hot flashes  Respiratory: Negative.    Cardiovascular: Negative.   Gastrointestinal: Negative.   Genitourinary: Negative.   Neurological: Negative.      Objective:    Physical Exam Vitals and nursing note reviewed.  Pulmonary:     Effort: Pulmonary effort is normal.     Comments: Able to talk in complete sentences Neurological:     Mental Status: She is oriented to person, place, and time.  Psychiatric:        Thought Content: Thought content normal.    There were no vitals taken for this visit. Wt Readings from Last 3 Encounters:  05/23/21 199 lb 6.4 oz (90.4 kg)  05/08/21 196 lb (88.9 kg)  02/03/21 198 lb 3.2 oz (89.9 kg)     Health Maintenance Due  Topic Date Due   COVID-19 Vaccine (1) Never done   Zoster Vaccines- Shingrix (1 of 2) Never done   MAMMOGRAM  03/11/2019    There are no preventive care reminders to display for this patient.  Lab Results  Component Value Date   TSH 3.180 03/04/2021   Lab Results  Component Value Date   WBC 5.4 05/23/2021   HGB 14.6 05/23/2021   HCT 43.4 05/23/2021   MCV 99 (H) 05/23/2021   PLT 232 05/23/2021   Lab Results  Component Value Date   NA 142 05/23/2021   K 4.3 05/23/2021   CO2 23 05/23/2021   GLUCOSE 88 05/23/2021   BUN 14 05/23/2021   CREATININE 0.86 05/23/2021   BILITOT 0.3 05/23/2021   ALKPHOS 104 05/23/2021   AST 15 05/23/2021   ALT 20 05/23/2021   PROT 6.1 05/23/2021   ALBUMIN 4.3 05/23/2021   CALCIUM 9.0 05/23/2021   ANIONGAP 4 (L) 01/05/2014   EGFR 78 05/23/2021   Lab Results   Component Value Date   CHOL 209 (H) 05/23/2021   Lab Results  Component Value Date   HDL 41 05/23/2021   Lab Results  Component Value Date   LDLCALC 136 (H) 05/23/2021   Lab Results  Component Value Date   TRIG 180 (H) 05/23/2021   Lab Results  Component Value Date   CHOLHDL 5.6 (H) 09/14/2018   No results found for: HGBA1C    Assessment & Plan:   Problem List Items Addressed This Visit       Cardiovascular and Mediastinum   Menopausal syndrome (hot flashes) - Primary    Ongoing, not controlled.  Unable to afford the clonidine patch.  We will start gabapentin 300 mg at bedtime.  After 3 to 4 days if she is not noticing improvement she can increase this to 300 mg twice a day.  Follow-up in 1 month.  She states that if she cannot afford this medication or does not work, she may just live with the hot flashes.  She has tried estrogen patches and black cohosh over-the-counter without relief.       Meds ordered this encounter  Medications   gabapentin (NEURONTIN) 300 MG capsule    Sig: Take 1 capsule (300 mg total) by mouth 2 (two) times daily.    Dispense:  60 capsule    Refill:  1   amphetamine-dextroamphetamine (ADDERALL XR) 20 MG 24 hr capsule    Sig: Take 1 capsule (20 mg total) by mouth daily.    Dispense:  30 capsule    Refill:  0    Follow-up: Return in about 4 weeks (around 08/22/2021) for ADHD with Dr. Neomia Dear.    This visit was completed via telephone due to the restrictions of the COVID-19 pandemic. All issues as above were discussed and addressed but no physical exam was performed. If it was felt that the patient should be evaluated in the office, they were directed there. The patient verbally consented to this visit. Patient was unable to complete an audio/visual visit due to Technical difficulties, Lack of internet. Due to the catastrophic nature of the COVID-19 pandemic, this visit was done through audio contact only. Location of the patient:  home Location of the provider: work Those involved with this call:  Provider: Vance Peper, NP CMA: Louanna Raw, [place] Desk/Registration: Myrlene Broker  Time spent on call:  10 minutes on the phone discussing health concerns. 10 minutes total spent in review of patient's record and preparation of their chart.   Charyl Dancer, NP

## 2021-08-10 ENCOUNTER — Telehealth: Payer: Self-pay | Admitting: Family Medicine

## 2021-08-13 NOTE — Telephone Encounter (Signed)
Patient called in checking status of refill of Citalopram . Says not delegated, please call back for further info.

## 2021-08-19 ENCOUNTER — Other Ambulatory Visit: Payer: Self-pay | Admitting: Obstetrics and Gynecology

## 2021-08-21 ENCOUNTER — Other Ambulatory Visit: Payer: Self-pay | Admitting: Internal Medicine

## 2021-08-21 MED ORDER — CITALOPRAM HYDROBROMIDE 20 MG PO TABS: 20.0000 mg | ORAL_TABLET | Freq: Every day | ORAL | 0 refills | Status: DC

## 2021-08-21 NOTE — Telephone Encounter (Signed)
Requested Prescriptions  Pending Prescriptions Disp Refills   citalopram (CELEXA) 20 MG tablet 90 tablet 0    Sig: Take 1 tablet (20 mg total) by mouth daily.     Psychiatry:  Antidepressants - SSRI Passed - 08/21/2021  2:17 PM      Passed - Completed PHQ-2 or PHQ-9 in the last 360 days      Passed - Valid encounter within last 6 months    Recent Outpatient Visits          3 weeks ago Menopausal syndrome (hot flashes)   [place], Lauren A, NP   1 month ago Menopausal syndrome (hot flashes)   [place], Lauren A, NP   3 months ago Attention deficit hyperactivity disorder (ADHD), unspecified ADHD type   [place], Lauren A, NP   6 months ago Right low back pain, unspecified chronicity, unspecified whether sciatica present   [place], Lauren A, NP   7 months ago Attention deficit hyperactivity disorder (ADHD), unspecified ADHD type   [facility], Scheryl Darter, NP

## 2021-08-21 NOTE — Telephone Encounter (Signed)
Copied from [place] [id number]. Topic: Quick Communication - Rx Refill/Question >> Aug 21, 2021  2:13 PM Tessa Lerner A wrote: Medication: citalopram (CELEXA) 20 MG tablet [[id number]] - patient has zero tablets remaining   Has the patient contacted their pharmacy? Yes.  The patient has been directed to contact their PCP (Agent: If no, request that the patient contact the pharmacy for the refill. If patient does not wish to contact the pharmacy document the reason why and proceed with request.) (Agent: If yes, when and what did the pharmacy advise?)  Preferred Pharmacy (with phone number or street name): [facility] (N), [place] - [address] [place] [address] Phone: [phone] Fax: [phone]   Has the patient been seen for an appointment in the last year OR does the patient have an upcoming appointment? Yes.    Agent: Please be advised that RX refills may take up to 3 business days. We ask that you follow-up with your pharmacy.

## 2021-08-22 ENCOUNTER — Telehealth: Payer: 59 | Admitting: Internal Medicine

## 2021-09-01 ENCOUNTER — Ambulatory Visit: Payer: Self-pay | Admitting: Internal Medicine

## 2021-09-15 ENCOUNTER — Encounter: Payer: Self-pay | Admitting: Internal Medicine

## 2021-09-15 ENCOUNTER — Ambulatory Visit (INDEPENDENT_AMBULATORY_CARE_PROVIDER_SITE_OTHER): Payer: 59 | Admitting: Internal Medicine

## 2021-09-15 ENCOUNTER — Other Ambulatory Visit: Payer: Self-pay

## 2021-09-15 VITALS — BP 132/87 | HR 60 | Temp 98.3°F | Ht 66.77 in | Wt 205.4 lb

## 2021-09-15 DIAGNOSIS — F988 Other specified behavioral and emotional disorders with onset usually occurring in childhood and adolescence: Secondary | ICD-10-CM

## 2021-09-15 DIAGNOSIS — E039 Hypothyroidism, unspecified: Secondary | ICD-10-CM | POA: Diagnosis not present

## 2021-09-15 DIAGNOSIS — R69 Illness, unspecified: Secondary | ICD-10-CM | POA: Diagnosis not present

## 2021-09-15 MED ORDER — AMPHETAMINE-DEXTROAMPHET ER 20 MG PO CP24: 20.0000 mg | ORAL_CAPSULE | Freq: Every day | ORAL | 0 refills | Status: DC

## 2021-09-15 NOTE — Progress Notes (Signed)
Ht 5' 6.77" (1.696 m)   Wt 205 lb 6.4 oz (93.2 kg)   BMI 32.39 kg/m    Subjective:    Patient ID: Jennifer Whitney, female    DOB: 04-Mar-1961, 61 y.o.   MRN: [id number]  Chief Complaint  Patient presents with   ADHD   Menopausal Syndrome    HPI: Jennifer Whitney is a 61 y.o. female  Pt is here for a follow up. Pt is on gabapentin for hot flashes doenst help much per pt. Pt has ADD says she struggled to stay focused , couldn't keep on tasks with anything adderall helps x 4 yrs now.   Thyroid Problem Presents for follow-up visit. Symptoms include heat intolerance. Patient reports no anxiety, cold intolerance, constipation, depressed mood, diaphoresis, diarrhea, dry skin, fatigue, hair loss, hoarse voice, leg swelling, menstrual problem, nail problem, palpitations, tremors, visual change, weight gain or weight loss.   Chief Complaint  Patient presents with   ADHD   Menopausal Syndrome    Relevant past medical, surgical, family and social history reviewed and updated as indicated. Interim medical history since our last visit reviewed. Allergies and medications reviewed and updated.  Review of Systems  Constitutional:  Negative for diaphoresis, fatigue, weight gain and weight loss.  HENT:  Negative for hoarse voice.   Cardiovascular:  Negative for palpitations.  Gastrointestinal:  Negative for constipation and diarrhea.  Endocrine: Positive for heat intolerance. Negative for cold intolerance.  Genitourinary:  Negative for menstrual problem.  Neurological:  Negative for tremors.  Psychiatric/Behavioral:  The patient is not nervous/anxious.    Per HPI unless specifically indicated above     Objective:    Ht 5' 6.77" (1.696 m)   Wt 205 lb 6.4 oz (93.2 kg)   BMI 32.39 kg/m   Wt Readings from Last 3 Encounters:  09/15/21 205 lb 6.4 oz (93.2 kg)  05/23/21 199 lb 6.4 oz (90.4 kg)  05/08/21 196 lb (88.9 kg)    Physical Exam Vitals and nursing note reviewed.  Constitutional:       General: She is not in acute distress.    Appearance: Normal appearance. She is not ill-appearing or diaphoretic.  Eyes:     Conjunctiva/sclera: Conjunctivae normal.  Cardiovascular:     Rate and Rhythm: Normal rate.     Heart sounds: No murmur heard. Pulmonary:     Effort: No respiratory distress.     Breath sounds: No rhonchi.  Abdominal:     General: Abdomen is flat. Bowel sounds are normal. There is no distension.     Palpations: Abdomen is soft. There is no mass.     Tenderness: There is no abdominal tenderness. There is no guarding.  Skin:    General: Skin is warm and dry.     Coloration: Skin is not jaundiced.     Findings: No erythema.  Neurological:     Mental Status: She is alert.    Results for orders placed or performed in visit on 05/23/21  CBC with Differential/Platelet  Result Value Ref Range   WBC 5.4 3.4 - 10.8 x10E3/uL   RBC 4.40 3.77 - 5.28 x10E6/uL   Hemoglobin 14.6 11.1 - 15.9 g/dL   Hematocrit 86.5 78.4 - 46.6 %   MCV 99 (H) 79 - 97 fL   MCH 33.2 (H) 26.6 - 33.0 pg   MCHC 33.6 31.5 - 35.7 g/dL   RDW 69.6 29.5 - 28.4 %   Platelets 232 150 - 450 x10E3/uL  Neutrophils 65 Not Estab. %   Lymphs 20 Not Estab. %   Monocytes 11 Not Estab. %   Eos 3 Not Estab. %   Basos 1 Not Estab. %   Neutrophils Absolute 3.5 1.4 - 7.0 x10E3/uL   Lymphocytes Absolute 1.1 0.7 - 3.1 x10E3/uL   Monocytes Absolute 0.6 0.1 - 0.9 x10E3/uL   EOS (ABSOLUTE) 0.2 0.0 - 0.4 x10E3/uL   Basophils Absolute 0.1 0.0 - 0.2 x10E3/uL   Immature Granulocytes 0 Not Estab. %   Immature Grans (Abs) 0.0 0.0 - 0.1 x10E3/uL  Comprehensive metabolic panel  Result Value Ref Range   Glucose 88 70 - 99 mg/dL   BUN 14 6 - 24 mg/dL   Creatinine, Ser 6.96 0.57 - 1.00 mg/dL   eGFR 78 >29 BM/WUX/3.24   BUN/Creatinine Ratio 16 9 - 23   Sodium 142 134 - 144 mmol/L   Potassium 4.3 3.5 - 5.2 mmol/L   Chloride 106 96 - 106 mmol/L   CO2 23 20 - 29 mmol/L   Calcium 9.0 8.7 - 10.2 mg/dL   Total  Protein 6.1 6.0 - 8.5 g/dL   Albumin 4.3 3.8 - 4.9 g/dL   Globulin, Total 1.8 1.5 - 4.5 g/dL   Albumin/Globulin Ratio 2.4 (H) 1.2 - 2.2   Bilirubin Total 0.3 0.0 - 1.2 mg/dL   Alkaline Phosphatase 104 44 - 121 IU/L   AST 15 0 - 40 IU/L   ALT 20 0 - 32 IU/L  Lipid Panel w/o Chol/HDL Ratio  Result Value Ref Range   Cholesterol, Total 209 (H) 100 - 199 mg/dL   Triglycerides 401 (H) 0 - 149 mg/dL   HDL 41 >02 mg/dL   VLDL Cholesterol Cal 32 5 - 40 mg/dL   LDL Chol Calc (NIH) 725 (H) 0 - 99 mg/dL  HIV Antibody (routine testing w rflx)  Result Value Ref Range   HIV Screen 4th Generation wRfx Non Reactive Non Reactive  Hepatitis C Antibody  Result Value Ref Range   Hep C Virus Ab <0.1 0.0 - 0.9 s/co ratio        Current Outpatient Medications:    acetaminophen (TYLENOL) 325 MG tablet, Take by mouth., Disp: , Rfl:    amphetamine-dextroamphetamine (ADDERALL XR) 20 MG 24 hr capsule, Take 1 capsule (20 mg total) by mouth daily., Disp: 30 capsule, Rfl: 0   citalopram (CELEXA) 20 MG tablet, Take 1 tablet (20 mg total) by mouth daily., Disp: 90 tablet, Rfl: 0   gabapentin (NEURONTIN) 300 MG capsule, Take 1 capsule (300 mg total) by mouth 2 (two) times daily., Disp: 60 capsule, Rfl: 1   levothyroxine (SYNTHROID) 100 MCG tablet, Take 1 tablet by mouth once daily, Disp: 90 tablet, Rfl: 0   loratadine (CLARITIN) 10 MG tablet, Take 10 mg by mouth daily., Disp: , Rfl:    fluticasone (FLONASE) 50 MCG/ACT nasal spray, Place 2 sprays into the nose as needed., Disp: , Rfl:    meloxicam (MOBIC) 7.5 MG tablet, meloxicam 7.5 mg tablet  TAKE 1 TO 2 TABLETS BY MOUTH AS DIRECTED FOR ARTHRITIS, Disp: , Rfl:     Assessment & Plan:  ADD is on adderall for such x 8 yrs now   Hot flashes is on gabapentin for such   Hypothyroidism  Is on synthroid 100 mcg.  Chronic, ongoing with recent TSH within normal range.  Continue current medication regimen and adjust as needed.  Plan to recheck TSH at physical in 6  months.  Depression is  on celexa for such stable, chronic.    Problem List Items Addressed This Visit   None    No orders of the defined types were placed in this encounter.    No orders of the defined types were placed in this encounter.    Follow up plan: No follow-ups on file.

## 2021-09-16 LAB — COMPREHENSIVE METABOLIC PANEL
ALT: 21 IU/L (ref 0–32)
AST: 16 IU/L (ref 0–40)
Albumin/Globulin Ratio: 2 (ref 1.2–2.2)
Albumin: 4.3 g/dL (ref 3.8–4.9)
Alkaline Phosphatase: 131 IU/L — ABNORMAL HIGH (ref 44–121)
BUN/Creatinine Ratio: 17 (ref 9–23)
BUN: 16 mg/dL (ref 6–24)
Bilirubin Total: <0.2 mg/dL (ref 0.0–1.2)
CO2: 24 mmol/L (ref 20–29)
Calcium: 9.2 mg/dL (ref 8.7–10.2)
Chloride: 103 mmol/L (ref 96–106)
Creatinine, Ser: 0.93 mg/dL (ref 0.57–1.00)
Globulin, Total: 2.2 g/dL (ref 1.5–4.5)
Glucose: 100 mg/dL — ABNORMAL HIGH (ref 70–99)
Potassium: 4.3 mmol/L (ref 3.5–5.2)
Sodium: 140 mmol/L (ref 134–144)
Total Protein: 6.5 g/dL (ref 6.0–8.5)
eGFR: 71 mL/min/{1.73_m2} (ref >59)

## 2021-09-16 LAB — CBC WITH DIFFERENTIAL/PLATELET
Basophils Absolute: 0.1 10*3/uL (ref 0.0–0.2)
Basos: 1 %
EOS (ABSOLUTE): 0.4 10*3/uL (ref 0.0–0.4)
Eos: 5 %
Hematocrit: 41.2 % (ref 34.0–46.6)
Hemoglobin: 13.9 g/dL (ref 11.1–15.9)
Immature Grans (Abs): 0 10*3/uL (ref 0.0–0.1)
Immature Granulocytes: 0 %
Lymphocytes Absolute: 1.7 10*3/uL (ref 0.7–3.1)
Lymphs: 24 %
MCH: 32.1 pg (ref 26.6–33.0)
MCHC: 33.7 g/dL (ref 31.5–35.7)
MCV: 95 fL (ref 79–97)
Monocytes Absolute: 0.6 10*3/uL (ref 0.1–0.9)
Monocytes: 8 %
Neutrophils Absolute: 4.5 10*3/uL (ref 1.4–7.0)
Neutrophils: 62 %
Platelets: 262 10*3/uL (ref 150–450)
RBC: 4.33 x10E6/uL (ref 3.77–5.28)
RDW: 12.8 % (ref 11.7–15.4)
WBC: 7.2 10*3/uL (ref 3.4–10.8)

## 2021-09-16 LAB — TSH: TSH: 3.82 u[IU]/mL (ref 0.450–4.500)

## 2021-09-16 LAB — T4, FREE: Free T4: 1.05 ng/dL (ref 0.82–1.77)

## 2021-09-26 ENCOUNTER — Ambulatory Visit: Payer: Self-pay

## 2021-10-12 ENCOUNTER — Other Ambulatory Visit: Payer: Self-pay | Admitting: Nurse Practitioner

## 2021-10-12 ENCOUNTER — Other Ambulatory Visit: Payer: Self-pay | Admitting: Internal Medicine

## 2021-10-13 MED ORDER — MELOXICAM 7.5 MG PO TABS: 7.5000 mg | ORAL_TABLET | Freq: Every day | ORAL | 4 refills | Status: DC

## 2021-10-13 MED ORDER — LEVOTHYROXINE SODIUM 100 MCG PO TABS: 100.0000 ug | ORAL_TABLET | Freq: Every day | ORAL | 0 refills | Status: DC

## 2021-10-13 NOTE — Telephone Encounter (Signed)
Patient requesting refill, ? ?Last visit was 09/15/21 ? ?Up coming visit is 12/15/21 ?

## 2021-10-13 NOTE — Telephone Encounter (Signed)
Patient requesting refill ? ?Last visit was 09/15/21 ? ?Up coming visit is 12/15/21 ?

## 2021-10-24 ENCOUNTER — Other Ambulatory Visit: Payer: Self-pay | Admitting: Internal Medicine

## 2021-10-24 NOTE — Telephone Encounter (Signed)
Requested medication (s) are due for refill today: yes  ? ?Requested medication (s) are on the active medication list: yes ? ?Last refill:  09/15/21 #30 0 refills ? ?Future visit scheduled: yes 1 month  ? ?Notes to clinic:  not delegated per protocol. Do you want to refill Rx? ? ? ?  ?Requested Prescriptions  ?Pending Prescriptions Disp Refills  ? amphetamine-dextroamphetamine (ADDERALL XR) 20 MG 24 hr capsule 30 capsule 0  ?  Sig: Take 1 capsule (20 mg total) by mouth daily.  ?  ? Not Delegated - Psychiatry:  Stimulants/ADHD Failed - 10/24/2021  1:16 PM  ?  ?  Failed - This refill cannot be delegated  ?  ?  Failed - Urine Drug Screen completed in last 360 days  ?  ?  Passed - Last BP in normal range  ?  BP Readings from Last 1 Encounters:  ?09/15/21 132/87  ?  ?  ?  ?  Passed - Last Heart Rate in normal range  ?  Pulse Readings from Last 1 Encounters:  ?09/15/21 60  ?  ?  ?  ?  Passed - Valid encounter within last 6 months  ?  Recent Outpatient Visits   ? ?      ? 1 month ago Hypothyroidism, unspecified type  ? [facility], Avanti, MD  ? 3 months ago Menopausal syndrome (hot flashes)  ? [place], NP  ? 3 months ago Menopausal syndrome (hot flashes)  ? [place], NP  ? 5 months ago Attention deficit hyperactivity disorder (ADHD), unspecified ADHD type  ? [place], Lauren A, NP  ? 8 months ago Right low back pain, unspecified chronicity, unspecified whether sciatica present  ? [facility], Lauren A, NP  ? ?  ?  ?Future Appointments   ? ?        ? In 1 month Vigg, Avanti, MD [facility]  ? ?  ? ?  ?  ?  ? ?

## 2021-10-24 NOTE — Telephone Encounter (Signed)
Medication Refill - Medication: amphetamine-dextroamphetamine (ADDERALL XR) 20 MG 24 hr capsule  ? ?Pt also asked if all her meds for now on can be filled at [facility] on [address]  ? ?Has the patient contacted their pharmacy? Yes.   ?(Agent: If no, request that the patient contact the pharmacy for the refill. If patient does not wish to contact the pharmacy document the reason why and proceed with request.) ?(Agent: If yes, when and what did the pharmacy advise?) ? ?Preferred Pharmacy (with phone number or street name): [facility] [address]  ?Has the patient been seen for an appointment in the last year OR does the patient have an upcoming appointment? Yes.   ? ?Agent: Please be advised that RX refills may take up to 3 business days. We ask that you follow-up with your pharmacy. ?

## 2021-10-27 MED ORDER — AMPHETAMINE-DEXTROAMPHET ER 20 MG PO CP24: 20.0000 mg | ORAL_CAPSULE | Freq: Every day | ORAL | 0 refills | Status: DC

## 2021-10-30 ENCOUNTER — Encounter: Payer: Self-pay | Admitting: Internal Medicine

## 2021-10-31 ENCOUNTER — Telehealth (INDEPENDENT_AMBULATORY_CARE_PROVIDER_SITE_OTHER): Payer: 59 | Admitting: Internal Medicine

## 2021-10-31 ENCOUNTER — Encounter: Payer: Self-pay | Admitting: Internal Medicine

## 2021-10-31 DIAGNOSIS — J069 Acute upper respiratory infection, unspecified: Secondary | ICD-10-CM

## 2021-10-31 MED ORDER — AMOXICILLIN-POT CLAVULANATE 875-125 MG PO TABS: 1.0000 | ORAL_TABLET | Freq: Two times a day (BID) | ORAL | 0 refills | Status: DC

## 2021-10-31 MED ORDER — BENZONATATE 100 MG PO CAPS: 100.0000 mg | ORAL_CAPSULE | Freq: Two times a day (BID) | ORAL | 0 refills | Status: DC | PRN

## 2021-10-31 MED ORDER — FEXOFENADINE HCL 180 MG PO TABS: 180.0000 mg | ORAL_TABLET | Freq: Every day | ORAL | 1 refills | Status: DC

## 2021-10-31 NOTE — Progress Notes (Addendum)
? ?There were no vitals taken for this visit.  ? ?Subjective:  ? ? Patient ID: Jennifer Whitney, female    DOB: [birth year], 61 y.o.   MRN: [id number] ? ?No chief complaint on file. ? ? ?This visit was completed via video visit through MyChart due to the restrictions of the COVID-19 pandemic. All issues as above were discussed and addressed. Physical exam was done as above through visual confirmation on video through MyChart. If it was felt that the patient should be evaluated in the office, they were directed there. The patient verbally consented to this visit. ?Location of the patient: home ?Location of the provider: work ?Those involved with this call:  ?Provider: Charlynne Cousins, MD ?CMA: Frazier Butt, CMA ?Time spent : 10 mins  ?HPI: ?Jennifer Whitney is a 61 y.o. female ? ?Had been exposed to someone at work who was coughing. ? ?Sore Throat  ?This is a new (took her allergy pills thinkin it was allergies, has had fver chills cough sore throat. feels drier today taking robitussin and nyquil) problem. The current episode started yesterday. The maximum temperature recorded prior to her arrival was 100.4 - 100.9 F. Associated symptoms include congestion, coughing and headaches. Pertinent negatives include no abdominal pain, diarrhea, drooling, ear pain, hoarse voice, plugged ear sensation, neck pain, shortness of breath, stridor, swollen glands, trouble swallowing or vomiting.  ? ?No chief complaint on file. ? ? ?Relevant past medical, surgical, family and social history reviewed and updated as indicated. Interim medical history since our last visit reviewed. ?Allergies and medications reviewed and updated. ? ?Review of Systems  ?HENT:  Positive for congestion. Negative for drooling, ear pain, hoarse voice and trouble swallowing.   ?Respiratory:  Positive for cough. Negative for shortness of breath and stridor.   ?Gastrointestinal:  Negative for abdominal pain, diarrhea and vomiting.  ?Musculoskeletal:  Negative for neck  pain.  ?Neurological:  Positive for headaches.  ? ?Per HPI unless specifically indicated above ? ?   ?Objective:  ?  ?There were no vitals taken for this visit.  ?Wt Readings from Last 3 Encounters:  ?09/15/21 205 lb 6.4 oz (93.2 kg)  ?05/23/21 199 lb 6.4 oz (90.4 kg)  ?05/08/21 196 lb (88.9 kg)  ?  ?Physical Exam ? ?Unable to peform sec to virtual visit.  ? ?Results for orders placed or performed in visit on 09/15/21  ?TSH  ?Result Value Ref Range  ? TSH 3.820 0.450 - 4.500 uIU/mL  ?T4, free  ?Result Value Ref Range  ? Free T4 1.05 0.82 - 1.77 ng/dL  ?CBC with Differential/Platelet  ?Result Value Ref Range  ? WBC 7.2 3.4 - 10.8 x10E3/uL  ? RBC 4.33 3.77 - 5.28 x10E6/uL  ? Hemoglobin 13.9 11.1 - 15.9 g/dL  ? Hematocrit 41.2 34.0 - 46.6 %  ? MCV 95 79 - 97 fL  ? MCH 32.1 26.6 - 33.0 pg  ? MCHC 33.7 31.5 - 35.7 g/dL  ? RDW 12.8 11.7 - 15.4 %  ? Platelets 262 150 - 450 x10E3/uL  ? Neutrophils 62 Not Estab. %  ? Lymphs 24 Not Estab. %  ? Monocytes 8 Not Estab. %  ? Eos 5 Not Estab. %  ? Basos 1 Not Estab. %  ? Neutrophils Absolute 4.5 1.4 - 7.0 x10E3/uL  ? Lymphocytes Absolute 1.7 0.7 - 3.1 x10E3/uL  ? Monocytes Absolute 0.6 0.1 - 0.9 x10E3/uL  ? EOS (ABSOLUTE) 0.4 0.0 - 0.4 x10E3/uL  ? Basophils Absolute 0.1 0.0 - 0.2  x10E3/uL  ? Immature Granulocytes 0 Not Estab. %  ? Immature Grans (Abs) 0.0 0.0 - 0.1 x10E3/uL  ?Comprehensive metabolic panel  ?Result Value Ref Range  ? Glucose 100 (H) 70 - 99 mg/dL  ? BUN 16 6 - 24 mg/dL  ? Creatinine, Ser 0.93 0.57 - 1.00 mg/dL  ? eGFR 71 >59 mL/min/1.73  ? BUN/Creatinine Ratio 17 9 - 23  ? Sodium 140 134 - 144 mmol/L  ? Potassium 4.3 3.5 - 5.2 mmol/L  ? Chloride 103 96 - 106 mmol/L  ? CO2 24 20 - 29 mmol/L  ? Calcium 9.2 8.7 - 10.2 mg/dL  ? Total Protein 6.5 6.0 - 8.5 g/dL  ? Albumin 4.3 3.8 - 4.9 g/dL  ? Globulin, Total 2.2 1.5 - 4.5 g/dL  ? Albumin/Globulin Ratio 2.0 1.2 - 2.2  ? Bilirubin Total <0.2 0.0 - 1.2 mg/dL  ? Alkaline Phosphatase 131 (H) 44 - 121 IU/L  ? AST 16 0 - 40  IU/L  ? ALT 21 0 - 32 IU/L  ? ?   ? ? ?Current Outpatient Medications:  ?  amoxicillin-clavulanate (AUGMENTIN) 875-125 MG tablet, Take 1 tablet by mouth 2 (two) times daily., Disp: 20 tablet, Rfl: 0 ?  benzonatate (TESSALON) 100 MG capsule, Take 1 capsule (100 mg total) by mouth 2 (two) times daily as needed for cough., Disp: 20 capsule, Rfl: 0 ?  fexofenadine (ALLEGRA ALLERGY) 180 MG tablet, Take 1 tablet (180 mg total) by mouth daily., Disp: 10 tablet, Rfl: 1 ?  acetaminophen (TYLENOL) 325 MG tablet, Take by mouth., Disp: , Rfl:  ?  amphetamine-dextroamphetamine (ADDERALL XR) 20 MG 24 hr capsule, Take 1 capsule (20 mg total) by mouth daily., Disp: 30 capsule, Rfl: 0 ?  citalopram (CELEXA) 20 MG tablet, Take 1 tablet (20 mg total) by mouth daily., Disp: 90 tablet, Rfl: 0 ?  fluticasone (FLONASE) 50 MCG/ACT nasal spray, Place 2 sprays into the nose as needed., Disp: , Rfl:  ?  gabapentin (NEURONTIN) 300 MG capsule, Take 1 capsule (300 mg total) by mouth 2 (two) times daily., Disp: 60 capsule, Rfl: 1 ?  levothyroxine (SYNTHROID) 100 MCG tablet, Take 1 tablet (100 mcg total) by mouth daily., Disp: 90 tablet, Rfl: 0 ?  loratadine (CLARITIN) 10 MG tablet, Take 10 mg by mouth daily., Disp: , Rfl:  ?  meloxicam (MOBIC) 7.5 MG tablet, Take 1 tablet (7.5 mg total) by mouth daily., Disp: 30 tablet, Rfl: 4  ? ? ?Assessment & Plan:  ?URI: check COVID / Flu and strep ordered at this visit. ?pt advised to take Tylenol q 4- 6 hourly as needed. pt to take allegra q pm as needed and to call office if symptoms worsened pt verbalised understanding of such. ? ? ?Problem List Items Addressed This Visit   ?None ?  ? ?No orders of the defined types were placed in this encounter. ?  ? ?Meds ordered this encounter  ?Medications  ? benzonatate (TESSALON) 100 MG capsule  ?  Sig: Take 1 capsule (100 mg total) by mouth 2 (two) times daily as needed for cough.  ?  Dispense:  20 capsule  ?  Refill:  0  ? fexofenadine (ALLEGRA ALLERGY) 180 MG  tablet  ?  Sig: Take 1 tablet (180 mg total) by mouth daily.  ?  Dispense:  10 tablet  ?  Refill:  1  ? amoxicillin-clavulanate (AUGMENTIN) 875-125 MG tablet  ?  Sig: Take 1 tablet by mouth 2 (two) times daily.  ?  Dispense:  20 tablet  ?  Refill:  0  ?  ? ?Follow up plan: ?No follow-ups on file. ? ? ?

## 2021-10-31 NOTE — Telephone Encounter (Signed)
Card scanned in to patients chart ?

## 2021-11-01 LAB — NOVEL CORONAVIRUS, NAA: SARS-CoV-2, NAA: DETECTED — AB

## 2021-11-02 LAB — CULTURE, GROUP A STREP: Strep A Culture: NEGATIVE

## 2021-11-02 LAB — VERITOR FLU A/B WAIVED
Influenza A: NEGATIVE
Influenza B: NEGATIVE

## 2021-11-02 LAB — RAPID STREP SCREEN (MED CTR MEBANE ONLY): Strep Gp A Ag, IA W/Reflex: NEGATIVE

## 2021-11-03 NOTE — Progress Notes (Signed)
Called pt Covid +ve , symptoms started last Tuesday. No symptoms at th epresnt time , pt ok to rtw with a mask on for the next 5 days.  ?Pt verbalized understanding  ?To call office if feels worse or symptoms recur.  ?

## 2021-11-27 ENCOUNTER — Encounter: Payer: Self-pay | Admitting: Internal Medicine

## 2021-12-11 ENCOUNTER — Ambulatory Visit (INDEPENDENT_AMBULATORY_CARE_PROVIDER_SITE_OTHER): Payer: 59 | Admitting: Internal Medicine

## 2021-12-11 ENCOUNTER — Encounter: Payer: Self-pay | Admitting: Internal Medicine

## 2021-12-11 VITALS — BP 144/92 | HR 71 | Temp 98.5°F | Ht 66.77 in | Wt 206.2 lb

## 2021-12-11 DIAGNOSIS — Z111 Encounter for screening for respiratory tuberculosis: Secondary | ICD-10-CM | POA: Diagnosis not present

## 2021-12-11 DIAGNOSIS — Z0000 Encounter for general adult medical examination without abnormal findings: Secondary | ICD-10-CM | POA: Diagnosis not present

## 2021-12-11 LAB — URINALYSIS, ROUTINE W REFLEX MICROSCOPIC
Bilirubin, UA: NEGATIVE
Glucose, UA: NEGATIVE
Ketones, UA: NEGATIVE
Nitrite, UA: NEGATIVE
Protein,UA: NEGATIVE
Specific Gravity, UA: >1.03 — ABNORMAL HIGH (ref 1.005–1.030)
Urobilinogen, Ur: 0.2 mg/dL (ref 0.2–1.0)
pH, UA: 5.5 (ref 5.0–7.5)

## 2021-12-11 LAB — MICROSCOPIC EXAMINATION

## 2021-12-11 MED ORDER — CITALOPRAM HYDROBROMIDE 20 MG PO TABS: 20.0000 mg | ORAL_TABLET | Freq: Every day | ORAL | 0 refills | Status: DC

## 2021-12-11 MED ORDER — AMPHETAMINE-DEXTROAMPHET ER 20 MG PO CP24: 20.0000 mg | ORAL_CAPSULE | Freq: Every day | ORAL | 0 refills | Status: DC

## 2021-12-11 MED ORDER — FLUTICASONE PROPIONATE 50 MCG/ACT NA SUSP: 2.0000 | Freq: Every day | NASAL | 1 refills | Status: AC

## 2021-12-11 NOTE — Progress Notes (Signed)
? ?BP (!) 144/92   Pulse 71   Temp 98.5 ?F (36.9 ?C) (Oral)   Ht 5' 6.77" (1.696 m)   Wt 206 lb 3.2 oz (93.5 kg)   SpO2 97%   BMI 32.52 kg/m?   ? ?Subjective:  ? ? Patient ID: Jennifer Whitney, female    DOB: 07/26/1961, 61 y.o.   MRN: [id number] ? ?Chief Complaint  ?Patient presents with  ? Forms  ?  Needs forms filled out for work and a tb test.  ? Medication Refill  ? ? ?HPI: ?Jennifer Whitney is a 61 y.o. female ? ?Pt is here for a physical  ? ? ?Chief Complaint  ?Patient presents with  ? Forms  ?  Needs forms filled out for work and a tb test.  ? Medication Refill  ? ? ?Relevant past medical, surgical, family and social history reviewed and updated as indicated. Interim medical history since our last visit reviewed. ?Allergies and medications reviewed and updated. ? ?Review of Systems  ?Constitutional:  Negative for activity change, appetite change, chills, fatigue and fever.  ?HENT:  Negative for congestion, ear discharge, ear pain and facial swelling.   ?Eyes:  Negative for pain and itching.  ?Respiratory:  Negative for cough, chest tightness, shortness of breath and wheezing.   ?Cardiovascular:  Negative for chest pain, palpitations and leg swelling.  ?Gastrointestinal:  Negative for abdominal distention, abdominal pain, blood in stool, constipation, diarrhea, nausea and vomiting.  ?Endocrine: Negative for cold intolerance, heat intolerance, polydipsia, polyphagia and polyuria.  ?Genitourinary:  Negative for difficulty urinating, dysuria, flank pain, frequency, hematuria and urgency.  ?Musculoskeletal:  Negative for arthralgias, gait problem, joint swelling and myalgias.  ?Skin:  Negative for color change, rash and wound.  ?Neurological:  Negative for dizziness, tremors, speech difficulty, weakness, light-headedness, numbness and headaches.  ?Hematological:  Does not bruise/bleed easily.  ?Psychiatric/Behavioral:  Negative for agitation, confusion, decreased concentration, sleep disturbance and suicidal  ideas.   ? ?Per HPI unless specifically indicated above ? ?   ?Objective:  ?  ?BP (!) 144/92   Pulse 71   Temp 98.5 ?F (36.9 ?C) (Oral)   Ht 5' 6.77" (1.696 m)   Wt 206 lb 3.2 oz (93.5 kg)   SpO2 97%   BMI 32.52 kg/m?   ?Wt Readings from Last 3 Encounters:  ?12/11/21 206 lb 3.2 oz (93.5 kg)  ?09/15/21 205 lb 6.4 oz (93.2 kg)  ?05/23/21 199 lb 6.4 oz (90.4 kg)  ?  ?Physical Exam ?Vitals and nursing note reviewed.  ?Constitutional:   ?   General: She is not in acute distress. ?   Appearance: Normal appearance. She is not ill-appearing or diaphoretic.  ?Eyes:  ?   Conjunctiva/sclera: Conjunctivae normal.  ?Pulmonary:  ?   Breath sounds: No rhonchi.  ?Abdominal:  ?   General: Abdomen is flat. Bowel sounds are normal. There is no distension.  ?   Palpations: Abdomen is soft. There is no mass.  ?   Tenderness: There is no abdominal tenderness. There is no guarding.  ?Skin: ?   General: Skin is warm and dry.  ?   Coloration: Skin is not jaundiced.  ?   Findings: No erythema.  ?Neurological:  ?   Mental Status: She is alert.  ? ? ?Results for orders placed or performed in visit on 12/11/21  ?Microscopic Examination  ? Urine  ?Result Value Ref Range  ? WBC, UA 0-5 0 - 5 /hpf  ? RBC 0-2 0 -  2 /hpf  ? Epithelial Cells (non renal) 0-10 0 - 10 /hpf  ? Mucus, UA Present (A) Not Estab.  ? Bacteria, UA Few (A) None seen/Few  ?Urinalysis, Routine w reflex microscopic  ?Result Value Ref Range  ? Specific Gravity, UA >1.030 (H) 1.005 - 1.030  ? pH, UA 5.5 5.0 - 7.5  ? Color, UA Yellow Yellow  ? Appearance Ur Clear Clear  ? Leukocytes,UA Trace (A) Negative  ? Protein,UA Negative Negative/Trace  ? Glucose, UA Negative Negative  ? Ketones, UA Negative Negative  ? RBC, UA Trace (A) Negative  ? Bilirubin, UA Negative Negative  ? Urobilinogen, Ur 0.2 0.2 - 1.0 mg/dL  ? Nitrite, UA Negative Negative  ? Microscopic Examination See below:   ? ?   ? ? ?Current Outpatient Medications:  ?  acetaminophen (TYLENOL) 325 MG tablet, Take by  mouth., Disp: , Rfl:  ?  levothyroxine (SYNTHROID) 100 MCG tablet, Take 1 tablet (100 mcg total) by mouth daily., Disp: 90 tablet, Rfl: 0 ?  loratadine (CLARITIN) 10 MG tablet, Take 10 mg by mouth daily., Disp: , Rfl:  ?  meloxicam (MOBIC) 7.5 MG tablet, Take 1 tablet (7.5 mg total) by mouth daily., Disp: 30 tablet, Rfl: 4 ?  amphetamine-dextroamphetamine (ADDERALL XR) 20 MG 24 hr capsule, Take 1 capsule (20 mg total) by mouth daily., Disp: 30 capsule, Rfl: 0 ?  citalopram (CELEXA) 20 MG tablet, Take 1 tablet (20 mg total) by mouth daily., Disp: 90 tablet, Rfl: 0 ?  fluticasone (FLONASE) 50 MCG/ACT nasal spray, Place 2 sprays into both nostrils daily., Disp: 11.1 mL, Rfl: 1  ? ? ?Assessment & Plan:  ?PHYSICAL :  Physical Wnl ?will check CMP, FLP, CBC,TSH, ua ? ?Needs forms filled for daycare application and a quantiferon for screening for Tb ?Will get lab ?Forms filled. Paperwork completed.  ? ? ?Problem List Items Addressed This Visit   ?None ?Visit Diagnoses   ? ? Screening for tuberculosis    -  Primary  ? Relevant Orders  ? QuantiFERON-TB Gold Plus  ? Annual physical exam      ? Relevant Orders  ? Urinalysis, Routine w reflex microscopic (Completed)  ? ?  ?  ? ?Orders Placed This Encounter  ?Procedures  ? Microscopic Examination  ? QuantiFERON-TB Gold Plus  ? Urinalysis, Routine w reflex microscopic  ?  ? ?Meds ordered this encounter  ?Medications  ? fluticasone (FLONASE) 50 MCG/ACT nasal spray  ?  Sig: Place 2 sprays into both nostrils daily.  ?  Dispense:  11.1 mL  ?  Refill:  1  ? citalopram (CELEXA) 20 MG tablet  ?  Sig: Take 1 tablet (20 mg total) by mouth daily.  ?  Dispense:  90 tablet  ?  Refill:  0  ? amphetamine-dextroamphetamine (ADDERALL XR) 20 MG 24 hr capsule  ?  Sig: Take 1 capsule (20 mg total) by mouth daily.  ?  Dispense:  30 capsule  ?  Refill:  0  ?  ? ?Follow up plan: ?No follow-ups on file. ? ? ? ?

## 2021-12-15 ENCOUNTER — Ambulatory Visit: Payer: 59 | Admitting: Internal Medicine

## 2021-12-16 LAB — QUANTIFERON-TB GOLD PLUS
QuantiFERON Mitogen Value: >10 IU/mL
QuantiFERON Nil Value: 0.37 IU/mL
QuantiFERON TB1 Ag Value: 0.1 IU/mL
QuantiFERON TB2 Ag Value: 0.08 IU/mL
QuantiFERON-TB Gold Plus: NEGATIVE

## 2021-12-18 ENCOUNTER — Ambulatory Visit: Payer: 59 | Admitting: Internal Medicine

## 2021-12-19 ENCOUNTER — Telehealth: Payer: Self-pay

## 2021-12-19 NOTE — Telephone Encounter (Signed)
Called patient and let her know that her paperwork is finished and at front desk waiting for pick up ?

## 2021-12-26 DIAGNOSIS — M25561 Pain in right knee: Secondary | ICD-10-CM | POA: Diagnosis not present

## 2022-01-02 DIAGNOSIS — M1711 Unilateral primary osteoarthritis, right knee: Secondary | ICD-10-CM | POA: Diagnosis not present

## 2022-01-15 ENCOUNTER — Other Ambulatory Visit: Payer: Self-pay

## 2022-01-16 ENCOUNTER — Encounter
Admission: RE | Admit: 2022-01-16 | Discharge: 2022-01-16 | Disposition: A | Discharge status: Home / Self Care | Payer: 59 | Source: Ambulatory Visit | Attending: Orthopedic Surgery | Admitting: Orthopedic Surgery

## 2022-01-16 ENCOUNTER — Other Ambulatory Visit: Payer: Self-pay

## 2022-01-16 VITALS — BP 138/88 | HR 76 | Temp 98.0°F | Resp 20 | Ht 67.0 in | Wt 212.3 lb

## 2022-01-16 DIAGNOSIS — E039 Hypothyroidism, unspecified: Secondary | ICD-10-CM

## 2022-01-16 DIAGNOSIS — G8929 Other chronic pain: Secondary | ICD-10-CM

## 2022-01-16 DIAGNOSIS — M25561 Pain in right knee: Secondary | ICD-10-CM | POA: Insufficient documentation

## 2022-01-16 DIAGNOSIS — Z01812 Encounter for preprocedural laboratory examination: Secondary | ICD-10-CM | POA: Diagnosis not present

## 2022-01-16 LAB — TYPE AND SCREEN
ABO/RH(D): A NEG
Antibody Screen: NEGATIVE

## 2022-01-16 LAB — CBC
HCT: 42.8 % (ref 36.0–46.0)
Hemoglobin: 14.5 g/dL (ref 12.0–15.0)
MCH: 32.6 pg (ref 26.0–34.0)
MCHC: 33.9 g/dL (ref 30.0–36.0)
MCV: 96.2 fL (ref 80.0–100.0)
Platelets: 258 10*3/uL (ref 150–400)
RBC: 4.45 MIL/uL (ref 3.87–5.11)
RDW: 13 % (ref 11.5–15.5)
WBC: 5.3 10*3/uL (ref 4.0–10.5)
nRBC: 0 % (ref 0.0–0.2)

## 2022-01-16 LAB — BASIC METABOLIC PANEL
Anion gap: 6 (ref 5–15)
BUN: 16 mg/dL (ref 6–20)
CO2: 28 mmol/L (ref 22–32)
Calcium: 9.1 mg/dL (ref 8.9–10.3)
Chloride: 106 mmol/L (ref 98–111)
Creatinine, Ser: 0.89 mg/dL (ref 0.44–1.00)
GFR, Estimated: >60 mL/min (ref >60)
Glucose, Bld: 92 mg/dL (ref 70–99)
Potassium: 3.9 mmol/L (ref 3.5–5.1)
Sodium: 140 mmol/L (ref 135–145)

## 2022-01-16 LAB — SURGICAL PCR SCREEN
MRSA, PCR: NEGATIVE
Staphylococcus aureus: POSITIVE — AB

## 2022-01-16 LAB — PROTIME-INR
INR: 1 (ref 0.8–1.2)
Prothrombin Time: 13.1 seconds (ref 11.4–15.2)

## 2022-01-16 NOTE — Patient Instructions (Addendum)
Your procedure is scheduled on:01/29/2022  Report to the Registration Desk on the 1st floor of the [facility]. To find out your arrival time, please call [phone] between 1PM - 3PM on: 01/28/2022  If your arrival time is 6:00 am, do not arrive prior to that time as the [facility] entrance doors do not open until 6:00 am.  REMEMBER: Instructions that are not followed completely may result in serious medical risk, up to and including death; or upon the discretion of your surgeon and anesthesiologist your surgery may need to be rescheduled.  Do not eat food after midnight the night before surgery.  No gum chewing, lozengers or hard candies.  You may however, drink CLEAR liquids up to 2 hours before you are scheduled to arrive for your surgery. Do not drink anything within 2 hours of your scheduled arrival time.  Clear liquids include: - water  - apple juice without pulp - gatorade (not RED colors) - black coffee or tea (Do NOT add milk or creamers to the coffee or tea) Do NOT drink anything that is not on this list.    TAKE THESE MEDICATIONS THE MORNING OF SURGERY WITH A SIP OF WATER: Adderral Celexa levothyroxine  4. Claritin    One week prior to surgery: Stop Anti-inflammatories (NSAIDS) such as Advil, Aleve, Ibuprofen, Motrin, Naproxen, Naprosyn and Aspirin based products such as Excedrin, Goodys Powder, BC Powder, meloxicam, and diclofenac Stop ANY OVER THE COUNTER supplements until after surgery. You may however, continue to take Tylenol if needed for pain up until the day of surgery.  No Alcohol for 24 hours before or after surgery.  No Smoking including e-cigarettes for 24 hours prior to surgery.  No chewable tobacco products for at least 6 hours prior to surgery.  No nicotine patches on the day of surgery.  Do not use any "recreational" drugs for at least a week prior to your surgery.  Please be advised that the combination of cocaine and anesthesia may have  negative outcomes, up to and including death. If you test positive for cocaine, your surgery will be cancelled.  On the morning of surgery brush your teeth with toothpaste and water, you may rinse your mouth with mouthwash if you wish. Do not swallow any toothpaste or mouthwash.  Use CHG Soap as directed on instruction sheet.  Do not wear jewelry, make-up, hairpins, clips or nail polish.  Do not wear lotions, powders, or perfumes.   Do not shave body from the neck down 48 hours prior to surgery just in case you cut yourself which could leave a site for infection.  Also, freshly shaved skin may become irritated if using the CHG soap.  Contact lenses, hearing aids and dentures may not be worn into surgery.  Do not bring valuables to the hospital. [facility] is not responsible for any missing/lost belongings or valuables.    Notify your doctor if there is any change in your medical condition (cold, fever, infection).  Wear comfortable clothing (specific to your surgery type) to the hospital.  After surgery, you can help prevent lung complications by doing breathing exercises.  Take deep breaths and cough every 1-2 hours. Your doctor may order a device called an Incentive Spirometer to help you take deep breaths.  If you are being admitted to the hospital overnight, leave your suitcase in the car. After surgery it may be brought to your room.  If you are being discharged the day of surgery, you will not be  allowed to drive home. You will need a responsible adult (18 years or older) to drive you home and stay with you that night.   If you are taking public transportation, you will need to have a responsible adult (18 years or older) with you. Please confirm with your physician that it is acceptable to use public transportation.   Please call the [facility] at [phone] if you have any questions about these instructions.  Surgery Visitation Policy:  Patients  undergoing a surgery or procedure may have two family members or support persons with them as long as the person is not COVID-19 positive or experiencing its symptoms.   Inpatient Visitation:    Visiting hours are 7 a.m. to 8 p.m. Up to four visitors are allowed at one time in a patient room, including children. The visitors may rotate out with other people during the day. One designated support person (adult) may remain overnight.

## 2022-01-19 ENCOUNTER — Other Ambulatory Visit: Payer: Self-pay | Admitting: Orthopedic Surgery

## 2022-01-19 ENCOUNTER — Encounter
Admission: RE | Admit: 2022-01-19 | Discharge: 2022-01-19 | Disposition: A | Discharge status: Home / Self Care | Payer: 59 | Source: Ambulatory Visit | Attending: Orthopedic Surgery | Admitting: Orthopedic Surgery

## 2022-01-19 DIAGNOSIS — Z01818 Encounter for other preprocedural examination: Secondary | ICD-10-CM

## 2022-01-19 LAB — URINALYSIS, ROUTINE W REFLEX MICROSCOPIC
Bilirubin Urine: NEGATIVE
Glucose, UA: NEGATIVE mg/dL
Hgb urine dipstick: NEGATIVE
Ketones, ur: NEGATIVE mg/dL
Leukocytes,Ua: NEGATIVE
Nitrite: NEGATIVE
Protein, ur: NEGATIVE mg/dL
Specific Gravity, Urine: 1.015 (ref 1.005–1.030)
pH: 5 (ref 5.0–8.0)

## 2022-01-19 MED ORDER — TRANEXAMIC ACID 1000 MG/10ML IV SOLN: 1000.0000 mg | INTRAVENOUS | Status: AC

## 2022-01-28 MED ORDER — CEFAZOLIN SODIUM-DEXTROSE 2-4 GM/100ML-% IV SOLN
2.0000 g | INTRAVENOUS | Status: AC
  Administered 2022-01-29: 2 g via INTRAVENOUS

## 2022-01-28 MED ORDER — ACETAMINOPHEN 500 MG PO TABS: 1000.0000 mg | ORAL_TABLET | ORAL | Status: AC

## 2022-01-28 MED ORDER — LACTATED RINGERS IV SOLN: INTRAVENOUS | Status: DC

## 2022-01-28 MED ORDER — FAMOTIDINE 20 MG PO TABS
20.0000 mg | ORAL_TABLET | Freq: Once | ORAL | Status: AC
  Administered 2022-01-29: 20 mg via ORAL

## 2022-01-28 MED ORDER — CHLORHEXIDINE GLUCONATE CLOTH 2 % EX PADS: 6.0000 | MEDICATED_PAD | Freq: Once | CUTANEOUS | Status: DC

## 2022-01-28 MED ORDER — ORAL CARE MOUTH RINSE: 15.0000 mL | Freq: Once | OROMUCOSAL | Status: AC

## 2022-01-28 MED ORDER — CHLORHEXIDINE GLUCONATE 0.12 % MT SOLN: 15.0000 mL | Freq: Once | OROMUCOSAL | Status: AC

## 2022-01-29 ENCOUNTER — Encounter: Payer: Self-pay | Admitting: Orthopedic Surgery

## 2022-01-29 ENCOUNTER — Ambulatory Visit: Payer: 59 | Admitting: Urgent Care

## 2022-01-29 ENCOUNTER — Ambulatory Visit: Payer: 59

## 2022-01-29 ENCOUNTER — Encounter
Admission: RE | Disposition: A | Discharge status: Home / Self Care | Payer: Self-pay | Source: Home / Self Care | Attending: Orthopedic Surgery

## 2022-01-29 ENCOUNTER — Other Ambulatory Visit: Payer: Self-pay

## 2022-01-29 ENCOUNTER — Observation Stay
Admission: RE | Admit: 2022-01-29 | Discharge: 2022-01-31 | Disposition: A | Discharge status: Home / Self Care | Payer: 59 | Attending: Orthopedic Surgery | Admitting: Orthopedic Surgery

## 2022-01-29 DIAGNOSIS — Z471 Aftercare following joint replacement surgery: Secondary | ICD-10-CM | POA: Diagnosis not present

## 2022-01-29 DIAGNOSIS — M1711 Unilateral primary osteoarthritis, right knee: Principal | ICD-10-CM | POA: Insufficient documentation

## 2022-01-29 DIAGNOSIS — Z79899 Other long term (current) drug therapy: Secondary | ICD-10-CM | POA: Diagnosis not present

## 2022-01-29 DIAGNOSIS — E039 Hypothyroidism, unspecified: Secondary | ICD-10-CM | POA: Diagnosis not present

## 2022-01-29 DIAGNOSIS — Z87891 Personal history of nicotine dependence: Secondary | ICD-10-CM | POA: Diagnosis not present

## 2022-01-29 DIAGNOSIS — Z96651 Presence of right artificial knee joint: Secondary | ICD-10-CM

## 2022-01-29 HISTORY — PX: TOTAL KNEE ARTHROPLASTY: SHX125

## 2022-01-29 LAB — CBC
HCT: 36.9 % (ref 36.0–46.0)
Hemoglobin: 12.7 g/dL (ref 12.0–15.0)
MCH: 32.7 pg (ref 26.0–34.0)
MCHC: 34.4 g/dL (ref 30.0–36.0)
MCV: 95.1 fL (ref 80.0–100.0)
Platelets: 207 10*3/uL (ref 150–400)
RBC: 3.88 MIL/uL (ref 3.87–5.11)
RDW: 13.4 % (ref 11.5–15.5)
WBC: 10.6 10*3/uL — ABNORMAL HIGH (ref 4.0–10.5)
nRBC: 0 % (ref 0.0–0.2)

## 2022-01-29 LAB — CREATININE, SERUM
Creatinine, Ser: 0.85 mg/dL (ref 0.44–1.00)
GFR, Estimated: >60 mL/min (ref >60)

## 2022-01-29 LAB — ABO/RH: ABO/RH(D): A NEG

## 2022-01-29 SURGERY — ARTHROPLASTY, KNEE, TOTAL
Anesthesia: General | Site: Knee | Laterality: Right

## 2022-01-29 MED ORDER — SODIUM CHLORIDE (PF) 0.9 % IJ SOLN
INTRAMUSCULAR | Status: DC | PRN
  Administered 2022-01-29: 60 mL via INTRAMUSCULAR

## 2022-01-29 MED ORDER — SODIUM CHLORIDE 0.9 % IV SOLN
Status: DC | PRN
  Administered 2022-01-29: 3012 mL

## 2022-01-29 MED ORDER — EPHEDRINE SULFATE (PRESSORS) 50 MG/ML IJ SOLN
INTRAMUSCULAR | Status: DC | PRN
  Administered 2022-01-29 (×4): 10 mg via INTRAVENOUS
  Administered 2022-01-29: 5 mg via INTRAVENOUS

## 2022-01-29 MED ORDER — BUPIVACAINE HCL (PF) 0.5 % IJ SOLN
INTRAMUSCULAR | Status: DC | PRN
  Administered 2022-01-29: 3 mL via INTRATHECAL

## 2022-01-29 MED ORDER — SODIUM CHLORIDE 0.9 % IV SOLN: INTRAVENOUS | Status: DC

## 2022-01-29 MED ORDER — FENTANYL CITRATE (PF) 100 MCG/2ML IJ SOLN: 25.0000 ug | INTRAMUSCULAR | Status: DC | PRN

## 2022-01-29 MED ORDER — METHOCARBAMOL 1000 MG/10ML IJ SOLN: 500.0000 mg | Freq: Four times a day (QID) | INTRAVENOUS | Status: DC | PRN

## 2022-01-29 MED ORDER — MORPHINE SULFATE 4 MG/ML IJ SOLN
INTRAMUSCULAR | Status: DC | PRN
  Administered 2022-01-29: 62 mL via INTRAMUSCULAR

## 2022-01-29 MED ORDER — TRANEXAMIC ACID-NACL 1000-0.7 MG/100ML-% IV SOLN
INTRAVENOUS | Status: AC
  Filled 2022-01-29: qty 100

## 2022-01-29 MED ORDER — BISACODYL 5 MG PO TBEC: 5.0000 mg | DELAYED_RELEASE_TABLET | Freq: Every day | ORAL | Status: DC | PRN

## 2022-01-29 MED ORDER — SODIUM CHLORIDE 0.9 % IR SOLN
Status: DC | PRN
  Administered 2022-01-29: 1004 mL

## 2022-01-29 MED ORDER — OXYCODONE HCL 5 MG PO TABS
10.0000 mg | ORAL_TABLET | ORAL | Status: DC | PRN
  Administered 2022-01-29: 10 mg via ORAL
  Administered 2022-01-30 (×2): 15 mg via ORAL
  Administered 2022-01-30: 10 mg via ORAL
  Administered 2022-01-31: 15 mg via ORAL
  Filled 2022-01-29 (×3): qty 3
  Filled 2022-01-29: qty 2

## 2022-01-29 MED ORDER — CEFAZOLIN SODIUM-DEXTROSE 2-4 GM/100ML-% IV SOLN
INTRAVENOUS | Status: AC
  Filled 2022-01-29: qty 100

## 2022-01-29 MED ORDER — BUPIVACAINE HCL (PF) 0.5 % IJ SOLN
INTRAMUSCULAR | Status: AC
  Filled 2022-01-29: qty 10

## 2022-01-29 MED ORDER — ACETAMINOPHEN 10 MG/ML IV SOLN: 1000.0000 mg | Freq: Once | INTRAVENOUS | Status: DC | PRN

## 2022-01-29 MED ORDER — ENOXAPARIN SODIUM 40 MG/0.4ML IJ SOSY
40.0000 mg | PREFILLED_SYRINGE | INTRAMUSCULAR | Status: DC
  Administered 2022-01-30 – 2022-01-31 (×2): 40 mg via SUBCUTANEOUS
  Filled 2022-01-29 (×2): qty 0.4

## 2022-01-29 MED ORDER — ACETAMINOPHEN 500 MG PO TABS
ORAL_TABLET | ORAL | Status: AC
  Administered 2022-01-29: 1000 mg via ORAL
  Filled 2022-01-29: qty 2

## 2022-01-29 MED ORDER — NEOMYCIN-POLYMYXIN B GU 40-200000 IR SOLN
Status: AC
  Filled 2022-01-29: qty 20

## 2022-01-29 MED ORDER — OXYCODONE HCL 5 MG PO TABS
ORAL_TABLET | ORAL | Status: AC
  Filled 2022-01-29: qty 1

## 2022-01-29 MED ORDER — BUPIVACAINE-EPINEPHRINE (PF) 0.25% -1:200000 IJ SOLN
INTRAMUSCULAR | Status: AC
  Filled 2022-01-29: qty 30

## 2022-01-29 MED ORDER — ONDANSETRON HCL 4 MG PO TABS: 4.0000 mg | ORAL_TABLET | Freq: Four times a day (QID) | ORAL | Status: DC | PRN

## 2022-01-29 MED ORDER — MIDAZOLAM HCL 2 MG/2ML IJ SOLN
INTRAMUSCULAR | Status: AC
  Filled 2022-01-29: qty 2

## 2022-01-29 MED ORDER — PHENYLEPHRINE 80 MCG/ML (10ML) SYRINGE FOR IV PUSH (FOR BLOOD PRESSURE SUPPORT)
PREFILLED_SYRINGE | INTRAVENOUS | Status: AC
  Filled 2022-01-29: qty 10

## 2022-01-29 MED ORDER — LORATADINE 10 MG PO TABS
10.0000 mg | ORAL_TABLET | Freq: Every day | ORAL | Status: DC
  Administered 2022-01-30 – 2022-01-31 (×2): 10 mg via ORAL
  Filled 2022-01-29 (×2): qty 1

## 2022-01-29 MED ORDER — AMPHETAMINE-DEXTROAMPHET ER 5 MG PO CP24
20.0000 mg | ORAL_CAPSULE | Freq: Every day | ORAL | Status: DC
  Filled 2022-01-29: qty 4

## 2022-01-29 MED ORDER — SENNOSIDES-DOCUSATE SODIUM 8.6-50 MG PO TABS: 1.0000 | ORAL_TABLET | Freq: Every evening | ORAL | Status: DC | PRN

## 2022-01-29 MED ORDER — LIDOCAINE HCL (PF) 2 % IJ SOLN
INTRAMUSCULAR | Status: AC
  Filled 2022-01-29: qty 5

## 2022-01-29 MED ORDER — KETOROLAC TROMETHAMINE 30 MG/ML IJ SOLN
INTRAMUSCULAR | Status: AC
  Filled 2022-01-29: qty 1

## 2022-01-29 MED ORDER — DOCUSATE SODIUM 100 MG PO CAPS
100.0000 mg | ORAL_CAPSULE | Freq: Two times a day (BID) | ORAL | Status: DC
  Administered 2022-01-29 – 2022-01-31 (×5): 100 mg via ORAL
  Filled 2022-01-29 (×5): qty 1

## 2022-01-29 MED ORDER — OXYCODONE HCL 5 MG PO TABS
5.0000 mg | ORAL_TABLET | Freq: Once | ORAL | Status: AC | PRN
  Administered 2022-01-29: 5 mg via ORAL

## 2022-01-29 MED ORDER — PROPOFOL 10 MG/ML IV BOLUS
INTRAVENOUS | Status: AC
  Filled 2022-01-29: qty 20

## 2022-01-29 MED ORDER — OXYCODONE HCL 5 MG/5ML PO SOLN: 5.0000 mg | Freq: Once | ORAL | Status: AC | PRN

## 2022-01-29 MED ORDER — FLUTICASONE PROPIONATE 50 MCG/ACT NA SUSP
2.0000 | Freq: Every day | NASAL | Status: DC | PRN
Start: 2022-01-29 — End: 2022-01-31

## 2022-01-29 MED ORDER — HYDROMORPHONE HCL 1 MG/ML IJ SOLN
0.5000 mg | INTRAMUSCULAR | Status: DC | PRN
  Administered 2022-01-30: 1 mg via INTRAVENOUS
  Filled 2022-01-29: qty 1

## 2022-01-29 MED ORDER — MIDAZOLAM HCL 5 MG/5ML IJ SOLN
INTRAMUSCULAR | Status: DC | PRN
  Administered 2022-01-29: 2 mg via INTRAVENOUS

## 2022-01-29 MED ORDER — TRANEXAMIC ACID 1000 MG/10ML IV SOLN
INTRAVENOUS | Status: AC
  Filled 2022-01-29: qty 10

## 2022-01-29 MED ORDER — EPHEDRINE 5 MG/ML INJ
INTRAVENOUS | Status: AC
  Filled 2022-01-29: qty 5

## 2022-01-29 MED ORDER — ACETAMINOPHEN 500 MG PO TABS
1000.0000 mg | ORAL_TABLET | Freq: Four times a day (QID) | ORAL | Status: AC
  Administered 2022-01-29 – 2022-01-30 (×4): 1000 mg via ORAL
  Filled 2022-01-29 (×4): qty 2

## 2022-01-29 MED ORDER — OXYCODONE HCL 5 MG PO TABS
5.0000 mg | ORAL_TABLET | ORAL | Status: DC | PRN
  Administered 2022-01-30 – 2022-01-31 (×3): 10 mg via ORAL
  Filled 2022-01-29 (×4): qty 2

## 2022-01-29 MED ORDER — PROPOFOL 500 MG/50ML IV EMUL
INTRAVENOUS | Status: DC | PRN
  Administered 2022-01-29: 50 ug/kg/min via INTRAVENOUS

## 2022-01-29 MED ORDER — LEVOTHYROXINE SODIUM 50 MCG PO TABS
100.0000 ug | ORAL_TABLET | Freq: Every day | ORAL | Status: DC
  Administered 2022-01-30 – 2022-01-31 (×2): 100 ug via ORAL
  Filled 2022-01-29 (×2): qty 2

## 2022-01-29 MED ORDER — PHENYLEPHRINE HCL-NACL 20-0.9 MG/250ML-% IV SOLN
INTRAVENOUS | Status: AC
  Filled 2022-01-29: qty 250

## 2022-01-29 MED ORDER — MORPHINE SULFATE (PF) 4 MG/ML IV SOLN
INTRAVENOUS | Status: AC
  Filled 2022-01-29: qty 1

## 2022-01-29 MED ORDER — PROPOFOL 1000 MG/100ML IV EMUL
INTRAVENOUS | Status: AC
  Filled 2022-01-29: qty 100

## 2022-01-29 MED ORDER — PHENYLEPHRINE 80 MCG/ML (10ML) SYRINGE FOR IV PUSH (FOR BLOOD PRESSURE SUPPORT)
PREFILLED_SYRINGE | INTRAVENOUS | Status: DC | PRN
  Administered 2022-01-29: 80 ug via INTRAVENOUS
  Administered 2022-01-29: 40 ug via INTRAVENOUS
  Administered 2022-01-29: 80 ug via INTRAVENOUS

## 2022-01-29 MED ORDER — FENTANYL CITRATE (PF) 100 MCG/2ML IJ SOLN
INTRAMUSCULAR | Status: AC
  Filled 2022-01-29: qty 2

## 2022-01-29 MED ORDER — LACTATED RINGERS IV SOLN: INTRAVENOUS | Status: DC

## 2022-01-29 MED ORDER — CHLORHEXIDINE GLUCONATE 0.12 % MT SOLN
OROMUCOSAL | Status: AC
  Administered 2022-01-29: 15 mL via OROMUCOSAL
  Filled 2022-01-29: qty 15

## 2022-01-29 MED ORDER — SODIUM CHLORIDE FLUSH 0.9 % IV SOLN
INTRAVENOUS | Status: AC
  Filled 2022-01-29: qty 40

## 2022-01-29 MED ORDER — DIPHENHYDRAMINE HCL 12.5 MG/5ML PO ELIX: 12.5000 mg | ORAL_SOLUTION | ORAL | Status: DC | PRN

## 2022-01-29 MED ORDER — BUPIVACAINE LIPOSOME 1.3 % IJ SUSP
INTRAMUSCULAR | Status: AC
  Filled 2022-01-29: qty 20

## 2022-01-29 MED ORDER — CITALOPRAM HYDROBROMIDE 10 MG PO TABS
20.0000 mg | ORAL_TABLET | Freq: Every day | ORAL | Status: DC
  Administered 2022-01-30 – 2022-01-31 (×2): 20 mg via ORAL
  Filled 2022-01-29 (×2): qty 2

## 2022-01-29 MED ORDER — ACETAMINOPHEN 325 MG PO TABS: 325.0000 mg | ORAL_TABLET | Freq: Four times a day (QID) | ORAL | Status: DC | PRN

## 2022-01-29 MED ORDER — METHOCARBAMOL 500 MG PO TABS
500.0000 mg | ORAL_TABLET | Freq: Four times a day (QID) | ORAL | Status: DC | PRN
  Administered 2022-01-29 – 2022-01-30 (×3): 500 mg via ORAL
  Filled 2022-01-29 (×3): qty 1

## 2022-01-29 MED ORDER — GLYCOPYRROLATE 0.2 MG/ML IJ SOLN
INTRAMUSCULAR | Status: AC
  Filled 2022-01-29: qty 1

## 2022-01-29 MED ORDER — FENTANYL CITRATE (PF) 100 MCG/2ML IJ SOLN
INTRAMUSCULAR | Status: DC | PRN
  Administered 2022-01-29: 50 ug via INTRAVENOUS
  Administered 2022-01-29 (×2): 25 ug via INTRAVENOUS

## 2022-01-29 MED ORDER — CEFAZOLIN SODIUM-DEXTROSE 2-4 GM/100ML-% IV SOLN
2.0000 g | Freq: Three times a day (TID) | INTRAVENOUS | Status: AC
  Administered 2022-01-29 (×2): 2 g via INTRAVENOUS
  Filled 2022-01-29 (×2): qty 100

## 2022-01-29 MED ORDER — TRAMADOL HCL 50 MG PO TABS
50.0000 mg | ORAL_TABLET | Freq: Four times a day (QID) | ORAL | Status: DC
  Administered 2022-01-29 – 2022-01-31 (×8): 50 mg via ORAL
  Filled 2022-01-29 (×8): qty 1

## 2022-01-29 MED ORDER — ONDANSETRON HCL 4 MG/2ML IJ SOLN
4.0000 mg | Freq: Four times a day (QID) | INTRAMUSCULAR | Status: DC | PRN
  Administered 2022-01-29: 4 mg via INTRAVENOUS
  Filled 2022-01-29: qty 2

## 2022-01-29 MED ORDER — PROPOFOL 10 MG/ML IV BOLUS
INTRAVENOUS | Status: DC | PRN
  Administered 2022-01-29 (×3): 20 mg via INTRAVENOUS

## 2022-01-29 MED ORDER — FAMOTIDINE 20 MG PO TABS
ORAL_TABLET | ORAL | Status: AC
  Filled 2022-01-29: qty 1

## 2022-01-29 MED ORDER — ONDANSETRON HCL 4 MG/2ML IJ SOLN: 4.0000 mg | Freq: Once | INTRAMUSCULAR | Status: DC | PRN

## 2022-01-29 MED ORDER — PHENYLEPHRINE HCL-NACL 20-0.9 MG/250ML-% IV SOLN
INTRAVENOUS | Status: DC | PRN
  Administered 2022-01-29: 26.667 ug/min via INTRAVENOUS
  Administered 2022-01-29: 15 ug/min via INTRAVENOUS

## 2022-01-29 MED ORDER — TRANEXAMIC ACID-NACL 1000-0.7 MG/100ML-% IV SOLN
INTRAVENOUS | Status: DC | PRN
  Administered 2022-01-29: 1000 mg via INTRAVENOUS

## 2022-01-29 SURGICAL SUPPLY — 76 items
BLADE SAW 90X13X1.19 OSCILLAT (BLADE) ×2 IMPLANT
BLADE SAW 90X25X1.19 OSCILLAT (BLADE) ×2 IMPLANT
CEMENT HV SMART SET (Cement) ×4 IMPLANT
CEMENT TIBIA MBT SIZE 4 (Knees) IMPLANT
CNTNR SPEC 2.5X3XGRAD LEK (MISCELLANEOUS) ×1
CONT SPEC 4OZ STER OR WHT (MISCELLANEOUS) ×1
CONTAINER SPEC 2.5X3XGRAD LEK (MISCELLANEOUS) ×1 IMPLANT
COOLER POLAR GLACIER W/PUMP (MISCELLANEOUS) ×2 IMPLANT
CUFF TOURN SGL QUICK 24 (TOURNIQUET CUFF)
CUFF TOURN SGL QUICK 34 (TOURNIQUET CUFF)
CUFF TRNQT CYL 24X4X16.5-23 (TOURNIQUET CUFF) IMPLANT
CUFF TRNQT CYL 34X4.125X (TOURNIQUET CUFF) IMPLANT
DRAPE 3/4 80X56 (DRAPES) ×4 IMPLANT
DRAPE IMP U-DRAPE 54X76 (DRAPES) ×4 IMPLANT
DRAPE INCISE IOBAN 66X60 STRL (DRAPES) ×2 IMPLANT
DRAPE SURG 17X11 SM STRL (DRAPES) ×4 IMPLANT
DRSG AQUACEL AG ADV 3.5X10 (GAUZE/BANDAGES/DRESSINGS) ×2 IMPLANT
DRSG MEPILEX SACRM 8.7X9.8 (GAUZE/BANDAGES/DRESSINGS) ×2 IMPLANT
DURAPREP 26ML APPLICATOR (WOUND CARE) ×8 IMPLANT
ELECT REM PT RETURN 9FT ADLT (ELECTROSURGICAL) ×2
ELECTRODE REM PT RTRN 9FT ADLT (ELECTROSURGICAL) ×1 IMPLANT
FEMUR SIGMA PS SZ 4.0N R (Femur) ×1 IMPLANT
GAUZE SPONGE 4X4 12PLY STRL (GAUZE/BANDAGES/DRESSINGS) ×2 IMPLANT
GLOVE BIOGEL PI IND STRL 9 (GLOVE) ×2 IMPLANT
GLOVE BIOGEL PI INDICATOR 9 (GLOVE) ×2
GLOVE SURG ORTHO 9.0 STRL STRW (GLOVE) ×8 IMPLANT
GLOVE SURG SYN 7.5  E (GLOVE) ×1
GLOVE SURG SYN 7.5 E (GLOVE) ×1 IMPLANT
GLOVE SURG SYN 7.5 PF PI (GLOVE) ×1 IMPLANT
GLOVE SURG UNDER POLY LF SZ7.5 (GLOVE) ×2 IMPLANT
GOWN STRL REUS TWL 2XL XL LVL4 (GOWN DISPOSABLE) ×2 IMPLANT
GOWN STRL REUS W/ TWL LRG LVL3 (GOWN DISPOSABLE) ×1 IMPLANT
GOWN STRL REUS W/ TWL LRG LVL4 (GOWN DISPOSABLE) ×1 IMPLANT
GOWN STRL REUS W/ TWL XL LVL3 (GOWN DISPOSABLE) ×1 IMPLANT
GOWN STRL REUS W/TWL 2XL LVL3 (GOWN DISPOSABLE) ×2 IMPLANT
GOWN STRL REUS W/TWL LRG LVL3 (GOWN DISPOSABLE) ×1
GOWN STRL REUS W/TWL LRG LVL4 (GOWN DISPOSABLE) ×1
GOWN STRL REUS W/TWL XL LVL3 (GOWN DISPOSABLE) ×1
HOLDER FOLEY CATH W/STRAP (MISCELLANEOUS) ×2 IMPLANT
IMMBOLIZER KNEE 19 BLUE UNIV (SOFTGOODS) ×2 IMPLANT
IV NS IRRIG 3000ML ARTHROMATIC (IV SOLUTION) ×2 IMPLANT
KIT TURNOVER KIT A (KITS) ×2 IMPLANT
MANIFOLD NEPTUNE II (INSTRUMENTS) ×4 IMPLANT
NDL SAFETY ECLIPSE 18X1.5 (NEEDLE) ×1 IMPLANT
NDL SPNL 20GX3.5 QUINCKE YW (NEEDLE) ×1 IMPLANT
NEEDLE HYPO 18GX1.5 SHARP (NEEDLE) ×1
NEEDLE HYPO 22GX1.5 SAFETY (NEEDLE) ×2 IMPLANT
NEEDLE SPNL 20GX3.5 QUINCKE YW (NEEDLE) ×2 IMPLANT
NS IRRIG 1000ML POUR BTL (IV SOLUTION) ×2 IMPLANT
PACK TOTAL KNEE (MISCELLANEOUS) ×2 IMPLANT
PAD ABD DERMACEA PRESS 5X9 (GAUZE/BANDAGES/DRESSINGS) ×3 IMPLANT
PAD CAST CTTN 4X4 STRL (SOFTGOODS) IMPLANT
PAD WRAPON POLAR KNEE (MISCELLANEOUS) ×1 IMPLANT
PADDING CAST COTTON 4X4 STRL (SOFTGOODS) ×1
PATELLA DOME PFC 38MM (Knees) ×1 IMPLANT
PENCIL SMOKE EVACUATOR COATED (MISCELLANEOUS) ×2 IMPLANT
PIN DRILL FIX HALF THREAD (BIT) ×4 IMPLANT
PIN FIXATION 1/8DIA X 3INL (PIN) ×4 IMPLANT
PLATE ROT INSERT 10MM SIZE 4 (Plate) ×1 IMPLANT
PULSAVAC PLUS IRRIG FAN TIP (DISPOSABLE) ×2
STAPLER SKIN PROX 35W (STAPLE) ×2 IMPLANT
STOCKINETTE BIAS CUT 6 980064 (GAUZE/BANDAGES/DRESSINGS) ×1 IMPLANT
SUCTION FRAZIER HANDLE 10FR (MISCELLANEOUS) ×1
SUCTION TUBE FRAZIER 10FR DISP (MISCELLANEOUS) ×1 IMPLANT
SUT ETHIBOND NAB CT1 #1 30IN (SUTURE) ×4 IMPLANT
SUT VIC AB 0 CT1 36 (SUTURE) ×2 IMPLANT
SUT VIC AB 2-0 CT1 (SUTURE) ×4 IMPLANT
SYR 20ML LL LF (SYRINGE) ×2 IMPLANT
SYR 30ML LL (SYRINGE) ×4 IMPLANT
TIBIA MBT CEMENT SIZE 4 (Knees) ×2 IMPLANT
TIP FAN IRRIG PULSAVAC PLUS (DISPOSABLE) ×1 IMPLANT
TOWER CARTRIDGE SMART MIX (DISPOSABLE) ×2 IMPLANT
TRAY FOLEY MTR SLVR 16FR STAT (SET/KITS/TRAYS/PACK) ×2 IMPLANT
TUBE SUCT KAM VAC (TUBING) ×2 IMPLANT
WATER STERILE IRR 500ML POUR (IV SOLUTION) ×2 IMPLANT
WRAPON POLAR PAD KNEE (MISCELLANEOUS) ×2

## 2022-01-29 NOTE — Anesthesia Procedure Notes (Addendum)
Date/Time: 01/29/2022 8:05 AM  Performed by: Demetrius Charity, CRNAPre-anesthesia Checklist: Patient identified, Emergency Drugs available, Suction available, Patient being monitored and Timeout performed Patient Re-evaluated:Patient Re-evaluated prior to induction Oxygen Delivery Method: Simple face mask Induction Type: IV induction Placement Confirmation: CO2 detector and positive ETCO2

## 2022-01-29 NOTE — Plan of Care (Signed)
?  Problem: Clinical Measurements: ?Goal: Will remain free from infection ?Outcome: Progressing ?  ?

## 2022-01-29 NOTE — Anesthesia Preprocedure Evaluation (Addendum)
Anesthesia Evaluation  Patient identified by MRN, date of birth, ID band Patient awake    Reviewed: Allergy & Precautions, NPO status , Patient's Chart, lab work & pertinent test results  History of Anesthesia Complications Negative for: history of anesthetic complications  Airway Mallampati: III   Neck ROM: Full    Dental  (+) Missing   Pulmonary former smoker (quit 1987),    Pulmonary exam normal breath sounds clear to auscultation       Cardiovascular Exercise Tolerance: Good negative cardio ROS Normal cardiovascular exam Rhythm:Regular Rate:Normal     Neuro/Psych PSYCHIATRIC DISORDERS (ADHD) Depression Chronic back pain    GI/Hepatic negative GI ROS,   Endo/Other  Hypothyroidism   Renal/GU negative Renal ROS     Musculoskeletal  (+) Arthritis ,   Abdominal   Peds  Hematology negative hematology ROS (+)   Anesthesia Other Findings   Reproductive/Obstetrics                            Anesthesia Physical Anesthesia Plan  ASA: 2  Anesthesia Plan: General and Spinal   Post-op Pain Management:    Induction: Intravenous  PONV Risk Score and Plan: 3 and Propofol infusion, TIVA, Treatment may vary due to age or medical condition and Ondansetron  Airway Management Planned: Natural Airway and Nasal Cannula  Additional Equipment:   Intra-op Plan:   Post-operative Plan:   Informed Consent: I have reviewed the patients History and Physical, chart, labs and discussed the procedure including the risks, benefits and alternatives for the proposed anesthesia with the patient or authorized representative who has indicated his/her understanding and acceptance.       Plan Discussed with: CRNA  Anesthesia Plan Comments: (Plan for spinal and GA with natural airway, LMA/GETA backup.  Patient consented for risks of anesthesia including but not limited to:  - adverse reactions to  medications - damage to eyes, teeth, lips or other oral mucosa - nerve damage due to positioning  - sore throat or hoarseness - headache, bleeding, infection, nerve damage 2/2 spinal - damage to heart, brain, nerves, lungs, other parts of body or loss of life  Informed patient about role of CRNA in peri- and intra-operative care.  Patient voiced understanding.)        Anesthesia Quick Evaluation

## 2022-01-29 NOTE — Anesthesia Procedure Notes (Signed)
Spinal  Patient location during procedure: OR Start time: 01/29/2022 8:00 AM End time: 01/29/2022 8:06 AM Reason for block: surgical anesthesia Staffing Performed: resident/CRNA  Resident/CRNA: Demetrius Charity, CRNA Performed by: Demetrius Charity, CRNA Authorized by: Darrin Nipper, MD   Preanesthetic Checklist Completed: patient identified, IV checked, site marked, risks and benefits discussed, surgical consent, monitors and equipment checked, pre-op evaluation and timeout performed Spinal Block Patient position: sitting Prep: ChloraPrep Patient monitoring: heart rate, continuous pulse ox, blood pressure and cardiac monitor Approach: midline Location: L3-4 Injection technique: single-shot Needle Needle type: Whitacre and Introducer  Needle gauge: 25 G Needle length: 9 cm Assessment Sensory level: T10 Events: CSF return Additional Notes Sterile aseptic technique used throughout the procedure.  Negative paresthesia. Negative blood return. Positive free-flowing CSF. Expiration date of kit checked and confirmed. Patient tolerated procedure well, without complications.

## 2022-01-29 NOTE — H&P (Signed)
PREOPERATIVE H&P  Chief Complaint: right knee osteoarthritis  HPI: Jennifer Whitney is a 61 y.o. female who presents for preoperative history and physical with a diagnosis of right knee osteoarthritis. Symptoms of right knee pain and swelling are significantly impairing activities of daily living including ambulation.  Patient has failed nonoperative management and wished to proceed with a right total knee arthroplasty.  Patient's radiographs have demonstrated joint space narrowing, marginal osteophytes and subchondral sclerosis.  Past Medical History:  Diagnosis Date   Acquired hypothyroidism 11/25/2015   ADHD    ADHD (attention deficit hyperactivity disorder), combined type 07/20/2018   Evaluation by Dr. 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   Past Surgical History:  Procedure Laterality Date   Bladder Mesh  2014   COLONOSCOPY WITH PROPOFOL N/A 04/22/2018   Procedure: COLONOSCOPY WITH PROPOFOL;  Surgeon: Lucilla Lame, MD;  Location: [facility];  Service: Endoscopy;  Laterality: N/A;   POLYPECTOMY N/A 04/22/2018   Procedure: POLYPECTOMY;  Surgeon: Lucilla Lame, MD;  Location: [facility];  Service: Endoscopy;  Laterality: N/A;   TOTAL ABDOMINAL HYSTERECTOMY     TUBAL LIGATION     Social History   Socioeconomic History   Marital status: Married    Spouse name: Jori Moll   Number of children: 6   Years of education: Not on file   Highest education level: Associate degree: academic program  Occupational History    Occupation: PE Instructor  Tobacco Use   Smoking status: Former    Packs/day: 1.00    Years: 8.00    Total pack years: 8.00    Types: Cigarettes    Start date: 08/10/1977    Quit date: 08/1985    Years since quitting: 36.4   Smokeless tobacco: Never  Vaping Use   Vaping Use: Never used  Substance and Sexual Activity   Alcohol use: Yes    Alcohol/week: 1.0 standard drink of alcohol    Types: 1 Glasses of wine per week   Drug use: No   Sexual activity: Not Currently    Partners: Male  Other Topics Concern   Not on file  Social History Narrative   Lives at home with husband.   Social Determinants of Health   Financial Resource Strain: Low Risk  (11/18/2018)   Overall Financial Resource Strain (CARDIA)    Difficulty of Paying Living Expenses: Not hard at all  Food Insecurity: No Food Insecurity (11/18/2018)   Hunger Vital Sign    Worried About Running Out of Food in the Last Year: Never true    Ran Out of Food in the Last Year: Never true  Transportation Needs: No Transportation Needs (11/18/2018)   PRAPARE - Hydrologist (Medical): No    Lack of Transportation (Non-Medical): No  Physical Activity: Sufficiently Active (11/18/2018)   Exercise Vital Sign    Days of Exercise per Week: 5 days    Minutes of Exercise per Session: 30 min  Stress: No Stress Concern Present (11/18/2018)  [place]    Feeling of Stress : Not at all  Social Connections: Socially Integrated (11/18/2018)   Social Connection and Isolation Panel [NHANES]    Frequency of Communication with Friends and Family: Three times a week    Frequency of Social Gatherings with Friends and Family: Three times a week    Attends Religious Services: More than 4 times per year    Active Member of Clubs or Organizations: Yes    Attends Music therapist: More than 4 times per year    Marital Status: Married   Family  History  Problem Relation Age of Onset   Heart failure Mother    Lung cancer Mother    Heart disease Mother    Stroke Father    Heart attack Father    Hypertension Sister    Hypertension Brother    Arrhythmia Daughter        SVT   Heart disease Paternal Grandmother    Emphysema Paternal Grandfather    Hypertension Sister    Prostate cancer Neg Hx    Kidney cancer Neg Hx    Breast cancer Neg Hx    No Known Allergies Prior to Admission medications   Medication Sig Start Date End Date Taking? Authorizing Provider  acetaminophen (TYLENOL) 325 MG tablet Take by mouth. 07/08/20  Yes [provider]  amphetamine-dextroamphetamine (ADDERALL XR) 20 MG 24 hr capsule Take 1 capsule (20 mg total) by mouth daily. 12/11/21  Yes Vigg, Avanti, MD  citalopram (CELEXA) 20 MG tablet Take 1 tablet (20 mg total) by mouth daily. 12/11/21  Yes Vigg, Avanti, MD  levothyroxine (SYNTHROID) 100 MCG tablet Take 1 tablet (100 mcg total) by mouth daily. 10/13/21  Yes Vigg, Avanti, MD  loratadine (CLARITIN) 10 MG tablet Take 10 mg by mouth daily.   Yes [provider]  diclofenac (VOLTAREN) 75 MG EC tablet Take 75 mg by mouth 2 (two) times daily.    [provider]  fluticasone (FLONASE) 50 MCG/ACT nasal spray Place 2 sprays into both nostrils daily. 12/11/21 12/12/22  Vigg, Avanti, MD  meloxicam (MOBIC) 7.5 MG tablet Take 1 tablet (7.5 mg total) by mouth daily. 10/13/21   Vigg, Avanti, MD     Positive ROS: All other systems have been reviewed and were otherwise negative with the exception of those mentioned in the HPI and as above.  Physical Exam: General: Alert, no acute distress Cardiovascular: Regular rate and rhythm, no murmurs rubs or gallops.  No pedal edema Respiratory: Clear to auscultation bilaterally, no wheezes rales or rhonchi. No cyanosis, no use of accessory musculature GI: No organomegaly, abdomen is soft and non-tender nondistended with positive bowel sounds. Skin: Skin  intact, no lesions within the operative field. Neurologic: Sensation intact distally Psychiatric: Patient is competent for consent with normal mood and affect Lymphatic: No cervical lymphadenopathy  MUSCULOSKELETAL: Right knee: Patient skin is intact.  There is no erythema ecchymosis or large effusion.  Patient's range of motion is from full extension to approximately 120 degrees of flexion.  She demonstrates no ligamentous laxity.  She has no calf tenderness or lower leg edema.  She has palpable pedal pulses, intact sensation light touch intact motor function distally.  Patient has patellofemoral crepitus and mild grind.  Assessment: right knee osteoarthritis  Plan: Plan for Procedure(s): RIGHT TOTAL KNEE ARTHROPLASTY  I reviewed the details of the operation as well as the postoperative course with the patient and her son  who is with her today.  Preop history and physical was performed at the bedside.  The right knee was marked according to hospital's correct site of surgery protocol.  I discussed the risks and benefits of surgery. The risks include but are not limited to infection, bleeding requiring blood transfusion, nerve or blood vessel injury, joint stiffness or loss of motion, persistent pain, weakness or instability, fracture, dislocation, hardware failure or loosening and the need for further surgery. Medical risks include but are not limited to DVT and pulmonary embolism, myocardial infarction, stroke, pneumonia, respiratory failure and death. Patient understood these risks and wished to proceed.     Thornton Park, MD   01/29/2022 7:55 AM

## 2022-01-29 NOTE — Anesthesia Postprocedure Evaluation (Signed)
Anesthesia Post Note  Patient: Jennifer Whitney  Procedure(s) Performed: TOTAL KNEE ARTHROPLASTY (Right: Knee)  Patient location during evaluation: PACU Anesthesia Type: Spinal Level of consciousness: awake and alert, oriented and patient cooperative Pain management: pain level controlled Vital Signs Assessment: post-procedure vital signs reviewed and stable Respiratory status: spontaneous breathing, nonlabored ventilation and respiratory function stable Cardiovascular status: blood pressure returned to baseline and stable Postop Assessment: adequate PO intake, no headache and spinal receding Anesthetic complications: no   No notable events documented.   Last Vitals:  Vitals:   01/29/22 1230 01/29/22 1300  BP: 136/84 131/73  Pulse: 67 72  Resp: 14 15  Temp:    SpO2: 94% 94%    Last Pain:  Vitals:   01/29/22 1300  TempSrc:   PainSc: 0-No pain                 Darrin Nipper

## 2022-01-29 NOTE — Progress Notes (Signed)
  Subjective:  POST OP CHECK s/p right TKA. Patient reports right knee pain as moderate.  Patient explains that she had nausea and vomiting when she sat up with PT.  Patient currently OOB to a chair.  Objective:   VITALS:   Vitals:   01/29/22 1300 01/29/22 1332 01/29/22 1443 01/29/22 1622  BP: 131/73 118/78 128/79 (!) 147/83  Pulse: 72 75 69 65  Resp: '15 14  16  '$ Temp:   98.3 F (36.8 C) 97.6 F (36.4 C)  TempSrc:   Oral   SpO2: 94% 97%  97%  Weight:      Height:        PHYSICAL EXAM: Patient in no acute distress OOB to chair. Right lower extremity: Neurovascular intact Sensation intact distally Intact pulses distally Dorsiflexion/Plantar flexion intact Incision: dressing C/D/I No cellulitis present Compartment soft  LABS  Results for orders placed or performed during the hospital encounter of 01/29/22 (from the past 24 hour(s))  ABO/Rh     Status: None   Collection Time: 01/29/22  6:49 AM  Result Value Ref Range   ABO/RH(D)      A NEG Performed at [facility], [address] [place], [place] [address]   CBC     Status: Abnormal   Collection Time: 01/29/22  2:46 PM  Result Value Ref Range   WBC 10.6 (H) 4.0 - 10.5 K/uL   RBC 3.88 3.87 - 5.11 MIL/uL   Hemoglobin 12.7 12.0 - 15.0 g/dL   HCT 36.9 36.0 - 46.0 %   MCV 95.1 80.0 - 100.0 fL   MCH 32.7 26.0 - 34.0 pg   MCHC 34.4 30.0 - 36.0 g/dL   RDW 13.4 11.5 - 15.5 %   Platelets 207 150 - 400 K/uL   nRBC 0.0 0.0 - 0.2 %  Creatinine, serum     Status: None   Collection Time: 01/29/22  2:46 PM  Result Value Ref Range   Creatinine, Ser 0.85 0.44 - 1.00 mg/dL   GFR, Estimated >60 >60 mL/min    DG Knee Right Port  Result Date: 01/29/2022 CLINICAL DATA:  Postop right knee replacement EXAM: PORTABLE RIGHT KNEE - 1-2 VIEW COMPARISON:  None Available. FINDINGS: Satisfactory right knee replacement. No fracture or complication. Fluid and gas in the joint. IMPRESSION: Satisfactory right knee replacement  Electronically Signed   By: Franchot Gallo M.D.   On: 01/29/2022 11:54    Assessment/Plan: Day of Surgery   Principal Problem:   S/P TKR (total knee replacement) using cement, right  Stable post-op.  Continue current pain medication.  I have reviewed the post-op xrays which demonstrate the TKA components are well positioned and there is no evidence of post-op complications.  Continue IV antibiotics (Kefzol) for 24 hours.  Labs will be rechecked in the AM.  Foley catheter will be removed in the AM.  Continue PT/OT tomorrow.  Lovenox for DVT prophylaxis will begin tomorrow.    Thornton Park , MD 01/29/2022, 4:58 PM

## 2022-01-29 NOTE — Op Note (Addendum)
DATE OF SURGERY:  01/29/2022 TIME: 11:22 AM  PATIENT NAME:  Jennifer Whitney   AGE: 61 y.o.    PRE-OPERATIVE DIAGNOSIS:  right knee osteoarthritis  POST-OPERATIVE DIAGNOSIS:  Same  PROCEDURE:  Procedure(s): RIGHT TOTAL KNEE ARTHROPLASTY  SURGEON:  Thornton Park, MD   ASSISTANT:  Danae Orleans, PA  OPERATIVE IMPLANTS: Depuy PFC Sigma, Posterior Stabilized Narrow Femoral component size 4, Tibia size rotating platform component size 4, Patella polyethylene 3-peg oval button size 38, with a 10 mm polyethylene insert.  EBL:  50 cc  TOURNIQUET TIME:  104  PREOPERATIVE INDICATIONS:  Jennifer Whitney is an 61 y.o. female who has a diagnosis of  right knee osteoarthritis and elected for a right total knee arthroplasty after failing nonoperative treatment.  Their knee pain significantly impacts their activity of daily living.  Radiographs have demonstrated tricompartmental osteoarthritis joint space narrowing, osteophytes, and subchondral sclerosis.  The risks, benefits, and alternatives were discussed at length including but not limited to the risks of infection, bleeding, nerve or blood vessel injury, knee stiffness, fracture, dislocation, loosening or failure of the hardware and the need for further surgery. Medical risks include but not limited to DVT and pulmonary embolism, myocardial infarction, stroke, pneumonia, respiratory failure and death. I discussed these risks with the patient in my office prior to the date of surgery. They understood these risks and were willing to proceed.  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  A Foley catheter was placed.  IV antibiotics were given. Patient received Ancef 2 g IV and tranexamic acid 1000 mg IV prior to the  inflation of the tourniquet. The lower extremity was prepped and draped in the usual sterile fashion.  A time out was performed to verify the patient's name, date of birth, medical record number, correct site of surgery and correct procedure to be performed. The timeout was also used to confirm the patient received antibiotics and that appropriate instruments, implants and radiographs studies were available in the room.  The leg was elevated and exsanguinated with an Esmarch and the tourniquet was inflated to 275 mmHg for 104 minutes..  A midline incision was made over the right knee. Full-thickness skin flaps were developed. A medial parapatellar arthrotomy was then made and the patella everted and the knee was brought into 90 of flexion. Hoffa's fat pad along with the cruciate ligaments and medial and lateral menisci were resected.   The distal femoral intramedullary canal was opened with a drill and the intramedullary distal femoral cutting jig was inserted into the femoral canal pinned into position. It was set at 5 degrees resecting 10 mm off the distal femur.  Care was taken to protect the collateral ligaments during distal femoral resection.  The distal femoral resection was performed with an oscillating saw. The femoral cutting guide was then removed.  The extramedullary tibial cutting guide was then placed using the anterior tibial crest and second ray of the foot as a references.  The tibial cutting guide was adjusted to allow for appropriate posterior slope.  The tibial cutting block was pinned into position. The slotted stylus was used to measure the proximal tibial resection of 10 mm off the high lateral side.  The tibial long rod alignment guide was then used to confirm position of the cutting block.  A third cross pin through the tibial cutting block was then drilled into position to allow for rotational stability. Care was taken during the tibial resection to protect the medial and collateral  ligaments.  The resected tibial bone was removed along with the posterior horns of the menisci.  The PCL was sacrificed.  Extension gap was measured with a spacer block and alignment and extension was confirmed using a long alignment rod.  The attention was then turned back to the femur. The posterior referencing distal femoral sizing guide was applied to the distal femur.  The femur was sized to be a size 4. Rotation of the referencing guide was checked with the epicondylar axis and Whitesides line. Then the 4-in-1 cutting jig was then applied to the distal femur. A stylus was used to confirm that the anterior femur would not be notched.   Then the anterior, posterior and chamfer femoral cuts were then made with an oscillating saw.  The flexion gap was then measured with a flexion spacer block and long alignment rod and was found to be symmetric with the extension gap and perpendicular to mechanical axis of the tibia.  The distal femoral preparation was completed by performing the posterior stabilized box cut using the cutting block. The entry site for the intramedullary femoral guide was filled with autologous bone graft from bone previously resected earlier in the case.  The proximal tibia plateau was then sized with trial trays. The best coverage was achieved with a size 4. This tibial tray was then pinned into position. The proximal tibia was then prepared with the reamer and keel punch.  After tibial preparation was completed, all trial components were inserted with polyethylene trials.  The knee was found to have excellent balance and full motion with a size 10 mm tibial polyethylene insert..    The attention was then turned to preparation of the patella. The thickness of the patella was measured with a caliper, the diameter measured with the patella templates.  Patient had a bipartite patella.  The patella resection was then made with an oscillating saw using the patella cutting guide.  The final  patellar diameter was 38 mm.  The patella component was not placed over the bipartite portion of the patella.  The bipartite patella was not significantly unstable and was not resected.  The 3 peg holes for the patella component were then drilled. The trial patella was then placed. Knee was taken through a full range of motion and deemed to be stable with the trial components. All trial components were then removed. The knee capsule was then injected with Exparel.  The knee joint capsule was injected with a mixture of quarter percent Marcaine, Toradol and morphine to assist with postoperative pain relief.  The joint was copiously irrigated with pulse lavage.  The final total knee arthroplasty components were then cemented into place with a 10 mm trial polyethylene insert and all excess methylmethacrylate was removed.  The joint was again copiously irrigated. After the cement had hardened the knee was again taken through a full range of motion. It was felt to be most stable with the 10 mm tibial polyethylene insert.  The tourniquet was let down at 104 minutes.  All bleeding vessels were cauterized. The actual tibial polyethylene insert was then placed. The knee was taken through a range of motion and the patella tracked well and the knee was again irrigated copiously.    The medial arthrotomy was closed with #1 Ethibond. The subcutaneous  tissue closed with 0 and 2-0 vicryl, and skin approximated with staples.  A dry sterile and compressive dressing was applied.  A Polar Care was applied to the operative knee along with a knee immobilizer.  The patient was awakened and brought to the PACU in stable and satisfactory condition.  All sharp, lap and instrument counts were correct at the conclusion the case. I spoke with the patient's son in the postop consultation room to let him know the case had been performed without complication and the patient was stable in recovery room.

## 2022-01-29 NOTE — Transfer of Care (Signed)
Immediate Anesthesia Transfer of Care Note  Patient: Jennifer Whitney  Procedure(s) Performed: TOTAL KNEE ARTHROPLASTY (Right: Knee)  Patient Location: PACU  Anesthesia Type:General  Level of Consciousness: awake, alert  and oriented  Airway & Oxygen Therapy: Patient Spontanous Breathing and Patient connected to face mask oxygen  Post-op Assessment: Report given to RN and Post -op Vital signs reviewed and stable  Post vital signs: Reviewed and stable  Last Vitals:  Vitals Value Taken Time  BP 133/80 01/29/22 1112  Temp    Pulse 86 01/29/22 1113  Resp 18 01/29/22 1113  SpO2 98 % 01/29/22 1113  Vitals shown include unvalidated device data.  Last Pain:  Vitals:   01/29/22 0651  TempSrc: Temporal  PainSc: 8       Patients Stated Pain Goal: 3 (36/06/77 0340)  Complications: No notable events documented.

## 2022-01-30 ENCOUNTER — Encounter: Payer: Self-pay | Admitting: Orthopedic Surgery

## 2022-01-30 DIAGNOSIS — Z79899 Other long term (current) drug therapy: Secondary | ICD-10-CM | POA: Diagnosis not present

## 2022-01-30 DIAGNOSIS — E039 Hypothyroidism, unspecified: Secondary | ICD-10-CM | POA: Diagnosis not present

## 2022-01-30 DIAGNOSIS — M1711 Unilateral primary osteoarthritis, right knee: Secondary | ICD-10-CM | POA: Diagnosis not present

## 2022-01-30 DIAGNOSIS — Z87891 Personal history of nicotine dependence: Secondary | ICD-10-CM | POA: Diagnosis not present

## 2022-01-30 LAB — BASIC METABOLIC PANEL
Anion gap: 3 — ABNORMAL LOW (ref 5–15)
BUN: 11 mg/dL (ref 6–20)
CO2: 27 mmol/L (ref 22–32)
Calcium: 8.3 mg/dL — ABNORMAL LOW (ref 8.9–10.3)
Chloride: 106 mmol/L (ref 98–111)
Creatinine, Ser: 0.78 mg/dL (ref 0.44–1.00)
GFR, Estimated: >60 mL/min (ref >60)
Glucose, Bld: 124 mg/dL — ABNORMAL HIGH (ref 70–99)
Potassium: 4.3 mmol/L (ref 3.5–5.1)
Sodium: 136 mmol/L (ref 135–145)

## 2022-01-30 LAB — CBC
HCT: 34.1 % — ABNORMAL LOW (ref 36.0–46.0)
Hemoglobin: 11.4 g/dL — ABNORMAL LOW (ref 12.0–15.0)
MCH: 32.1 pg (ref 26.0–34.0)
MCHC: 33.4 g/dL (ref 30.0–36.0)
MCV: 96.1 fL (ref 80.0–100.0)
Platelets: 183 10*3/uL (ref 150–400)
RBC: 3.55 MIL/uL — ABNORMAL LOW (ref 3.87–5.11)
RDW: 13.5 % (ref 11.5–15.5)
WBC: 7.8 10*3/uL (ref 4.0–10.5)
nRBC: 0 % (ref 0.0–0.2)

## 2022-01-30 NOTE — Plan of Care (Signed)
  Problem: Education: Goal: Knowledge of General Education information will improve Description: Including pain rating scale, medication(s)/side effects and non-pharmacologic comfort measures Outcome: Progressing   Problem: Clinical Measurements: Goal: Will remain free from infection Outcome: Progressing   Problem: Coping: Goal: Level of anxiety will decrease Outcome: Progressing   Problem: Nutrition: Goal: Adequate nutrition will be maintained Outcome: Progressing   Problem: Safety: Goal: Ability to remain free from injury will improve Outcome: Progressing

## 2022-01-31 DIAGNOSIS — Z111 Encounter for screening for respiratory tuberculosis: Secondary | ICD-10-CM | POA: Diagnosis not present

## 2022-01-31 DIAGNOSIS — Z87891 Personal history of nicotine dependence: Secondary | ICD-10-CM | POA: Diagnosis not present

## 2022-01-31 DIAGNOSIS — M1711 Unilateral primary osteoarthritis, right knee: Secondary | ICD-10-CM | POA: Diagnosis not present

## 2022-01-31 DIAGNOSIS — E039 Hypothyroidism, unspecified: Secondary | ICD-10-CM | POA: Diagnosis not present

## 2022-01-31 DIAGNOSIS — M25561 Pain in right knee: Secondary | ICD-10-CM | POA: Diagnosis not present

## 2022-01-31 DIAGNOSIS — Z96651 Presence of right artificial knee joint: Secondary | ICD-10-CM | POA: Diagnosis not present

## 2022-01-31 DIAGNOSIS — Z79899 Other long term (current) drug therapy: Secondary | ICD-10-CM | POA: Diagnosis not present

## 2022-01-31 DIAGNOSIS — Z0000 Encounter for general adult medical examination without abnormal findings: Secondary | ICD-10-CM | POA: Diagnosis not present

## 2022-01-31 LAB — CBC
HCT: 33.2 % — ABNORMAL LOW (ref 36.0–46.0)
Hemoglobin: 11.2 g/dL — ABNORMAL LOW (ref 12.0–15.0)
MCH: 33.1 pg (ref 26.0–34.0)
MCHC: 33.7 g/dL (ref 30.0–36.0)
MCV: 98.2 fL (ref 80.0–100.0)
Platelets: 214 10*3/uL (ref 150–400)
RBC: 3.38 MIL/uL — ABNORMAL LOW (ref 3.87–5.11)
RDW: 13.8 % (ref 11.5–15.5)
WBC: 10 10*3/uL (ref 4.0–10.5)
nRBC: 0 % (ref 0.0–0.2)

## 2022-01-31 MED ORDER — OXYCODONE HCL 5 MG PO TABS: 5.0000 mg | ORAL_TABLET | ORAL | 0 refills | Status: DC | PRN

## 2022-01-31 MED ORDER — ASPIRIN 325 MG PO TBEC: 325.0000 mg | DELAYED_RELEASE_TABLET | Freq: Two times a day (BID) | ORAL | 0 refills | Status: DC

## 2022-02-02 ENCOUNTER — Telehealth: Payer: Self-pay | Admitting: *Deleted

## 2022-02-04 DIAGNOSIS — M1711 Unilateral primary osteoarthritis, right knee: Secondary | ICD-10-CM | POA: Diagnosis not present

## 2022-02-05 ENCOUNTER — Ambulatory Visit: Payer: 59 | Attending: Orthopedic Surgery

## 2022-02-05 ENCOUNTER — Other Ambulatory Visit: Payer: Self-pay | Admitting: Orthopedic Surgery

## 2022-02-05 DIAGNOSIS — R2242 Localized swelling, mass and lump, left lower limb: Secondary | ICD-10-CM

## 2022-02-06 DIAGNOSIS — M25561 Pain in right knee: Secondary | ICD-10-CM | POA: Diagnosis not present

## 2022-02-06 DIAGNOSIS — M25661 Stiffness of right knee, not elsewhere classified: Secondary | ICD-10-CM | POA: Diagnosis not present

## 2022-02-09 ENCOUNTER — Other Ambulatory Visit: Payer: Self-pay | Admitting: Internal Medicine

## 2022-02-09 DIAGNOSIS — Z0000 Encounter for general adult medical examination without abnormal findings: Secondary | ICD-10-CM

## 2022-02-09 DIAGNOSIS — Z111 Encounter for screening for respiratory tuberculosis: Secondary | ICD-10-CM

## 2022-02-11 DIAGNOSIS — Z96651 Presence of right artificial knee joint: Secondary | ICD-10-CM | POA: Diagnosis not present

## 2022-02-19 DIAGNOSIS — M25661 Stiffness of right knee, not elsewhere classified: Secondary | ICD-10-CM | POA: Diagnosis not present

## 2022-02-19 DIAGNOSIS — M25561 Pain in right knee: Secondary | ICD-10-CM | POA: Diagnosis not present

## 2022-02-24 DIAGNOSIS — M25661 Stiffness of right knee, not elsewhere classified: Secondary | ICD-10-CM | POA: Diagnosis not present

## 2022-02-24 DIAGNOSIS — M25561 Pain in right knee: Secondary | ICD-10-CM | POA: Diagnosis not present

## 2022-02-27 DIAGNOSIS — M25561 Pain in right knee: Secondary | ICD-10-CM | POA: Diagnosis not present

## 2022-02-27 DIAGNOSIS — M25661 Stiffness of right knee, not elsewhere classified: Secondary | ICD-10-CM | POA: Diagnosis not present

## 2022-03-02 DIAGNOSIS — M25661 Stiffness of right knee, not elsewhere classified: Secondary | ICD-10-CM | POA: Diagnosis not present

## 2022-03-02 DIAGNOSIS — M5416 Radiculopathy, lumbar region: Secondary | ICD-10-CM | POA: Diagnosis not present

## 2022-03-02 DIAGNOSIS — M25561 Pain in right knee: Secondary | ICD-10-CM | POA: Diagnosis not present

## 2022-03-06 DIAGNOSIS — M25561 Pain in right knee: Secondary | ICD-10-CM | POA: Diagnosis not present

## 2022-03-06 DIAGNOSIS — M25661 Stiffness of right knee, not elsewhere classified: Secondary | ICD-10-CM | POA: Diagnosis not present

## 2022-03-11 ENCOUNTER — Ambulatory Visit: Payer: Self-pay

## 2022-03-11 NOTE — Telephone Encounter (Signed)
    Chief Complaint: Rectal bleeding x 2 today Symptoms: Bright red in with stool Frequency: Today Pertinent Negatives: Patient denies abdominal pain Disposition: '[]'$ ED /'[]'$ Urgent Care (no appt availability in office) / '[]'$ Appointment(In office/virtual)/ '[]'$  [facility]/ '[]'$ Home Care/ '[]'$ Refused Recommended Disposition /'[]'$ [facility]/ '[]'$  Follow-up with PCP Additional Notes: Had recent knee surgery.  Reason for Disposition  MODERATE rectal bleeding (small blood clots, passing blood without stool, or toilet water turns red)  Answer Assessment - Initial Assessment Questions 1. APPEARANCE of BLOOD: "What color is it?" "Is it passed separately, on the surface of the stool, or mixed in with the stool?"      Bright red 2. AMOUNT: "How much blood was passed?"      Moderate 3. FREQUENCY: "How many times has blood been passed with the stools?"      2 x 4. ONSET: "When was the blood first seen in the stools?" (Days or weeks)      Today 5. DIARRHEA: "Is there also some diarrhea?" If Yes, ask: "How many diarrhea stools in the past 24 hours?"      No 6. CONSTIPATION: "Do you have constipation?" If Yes, ask: "How bad is it?"     Mild  7. RECURRENT SYMPTOMS: "Have you had blood in your stools before?" If Yes, ask: "When was the last time?" and "What happened that time?"      Yes 8. BLOOD THINNERS: "Do you take any blood thinners?" (e.g., Coumadin/warfarin, Pradaxa/dabigatran, aspirin)     No 9. OTHER SYMPTOMS: "Do you have any other symptoms?"  (e.g., abdomen pain, vomiting, dizziness, fever)     No 10. PREGNANCY: "Is there any chance you are pregnant?" "When was your last menstrual period?"       No  Protocols used: Rectal Bleeding-A-AH

## 2022-03-12 ENCOUNTER — Encounter: Payer: Self-pay | Admitting: Physician Assistant

## 2022-03-12 ENCOUNTER — Ambulatory Visit (INDEPENDENT_AMBULATORY_CARE_PROVIDER_SITE_OTHER): Payer: 59 | Admitting: Physician Assistant

## 2022-03-12 ENCOUNTER — Other Ambulatory Visit: Payer: Self-pay

## 2022-03-12 VITALS — BP 119/81 | HR 75 | Temp 98.8°F | Ht 67.01 in | Wt 208.5 lb

## 2022-03-12 DIAGNOSIS — K625 Hemorrhage of anus and rectum: Secondary | ICD-10-CM | POA: Diagnosis not present

## 2022-03-12 DIAGNOSIS — K648 Other hemorrhoids: Secondary | ICD-10-CM

## 2022-03-12 DIAGNOSIS — E039 Hypothyroidism, unspecified: Secondary | ICD-10-CM | POA: Diagnosis not present

## 2022-03-12 NOTE — Assessment & Plan Note (Signed)
Unsure of chronicity  Reports she has had two episodes of painless rectal bleeding with bowel movements on Tuesday States she has had a bowel movement since without bleeding  Reviewed importance of increased fiber in diet to assist with constipation  Follow up as needed

## 2022-03-12 NOTE — Assessment & Plan Note (Signed)
Chronic, historic condition Will recheck TSH today Will send Levothyroxine script in once labs are returned Follow up in 6 months for monitoring

## 2022-03-12 NOTE — Progress Notes (Signed)
Acute Office Visit   Patient: Jennifer Whitney   DOB: 24-Jan-1961   61 y.o. Female  MRN: [id number] Visit Date: 03/12/2022  Today's healthcare provider: Dani Gobble Phineas Mcenroe, PA-C  Introduced myself to the patient as a Journalist, newspaper and provided education on APPs in clinical practice.    Chief Complaint  Patient presents with   Medication Refill    Wants to have her Levothyroxine   Blood In Stools    Has seen blood in stool twice, was bright red , Started on Tuesday. Patient states that she has been recently taking oxycodone for recent knee surgery which has been making her constipated.    Subjective    HPI HPI     Medication Refill    Additional comments: Wants to have her Levothyroxine        Blood In Stools    Additional comments: Has seen blood in stool twice, was bright red , Started on Tuesday. Patient states that she has been recently taking oxycodone for recent knee surgery which has been making her constipated.       Last edited by Jerelene Redden, CMA on 03/12/2022  2:15 PM.         Reports she had knee replacement surgery in June  Was taking oxycodone for pain which made her constipated Reports she noted some blood in her stool x2  She has had a bowel movement after this without blood present Reports bright red blood on Tuesday with bowel movements Denies pain with bowel movements, States she has been taking Meloxicam daily prior to her knee surgery but is no longer taking this  She stopped taking her Aspirin about a week ago    Medications: Outpatient Medications Prior to Visit  Medication Sig   acetaminophen (TYLENOL) 325 MG tablet Take by mouth.   amphetamine-dextroamphetamine (ADDERALL XR) 20 MG 24 hr capsule Take 1 capsule (20 mg total) by mouth daily.   citalopram (CELEXA) 20 MG tablet Take 1 tablet (20 mg total) by mouth daily.   fluticasone (FLONASE) 50 MCG/ACT nasal spray Place 2 sprays into both nostrils daily.   levothyroxine (SYNTHROID) 100 MCG  tablet Take 1 tablet (100 mcg total) by mouth daily.   loratadine (CLARITIN) 10 MG tablet Take 10 mg by mouth daily.   oxyCODONE (OXY IR/ROXICODONE) 5 MG immediate release tablet Take 1 tablet (5 mg total) by mouth every 4 (four) hours as needed for moderate pain.   meloxicam (MOBIC) 7.5 MG tablet Take 1 tablet by mouth daily.   [DISCONTINUED] aspirin EC 325 MG tablet Take 1 tablet (325 mg total) by mouth 2 (two) times daily. (Patient not taking: Reported on 03/12/2022)   No facility-administered medications prior to visit.    Review of Systems  Gastrointestinal:  Positive for blood in stool and constipation. Negative for abdominal pain, nausea and vomiting.       Some heartburn        Objective    BP 119/81   Pulse 75   Temp 98.8 F (37.1 C) (Oral)   Ht 5' 7.01" (1.702 m)   Wt 208 lb 8 oz (94.6 kg)   SpO2 99%   BMI 32.65 kg/m    Physical Exam Vitals reviewed.  Constitutional:      General: She is awake.     Appearance: Normal appearance. She is well-developed and well-groomed.  HENT:     Head: Normocephalic and atraumatic.  Eyes:  General: Lids are normal. Gaze aligned appropriately.     Extraocular Movements: Extraocular movements intact.     Conjunctiva/sclera: Conjunctivae normal.     Pupils: Pupils are equal, round, and reactive to light.  Genitourinary:    Rectum: Internal hemorrhoid present. No tenderness or external hemorrhoid.  Neurological:     Mental Status: She is alert.  Psychiatric:        Attention and Perception: Attention and perception normal.        Mood and Affect: Mood and affect normal.        Speech: Speech normal.        Behavior: Behavior normal. Behavior is cooperative.       No results found for any visits on 03/12/22.  Assessment & Plan      No follow-ups on file.      Problem List Items Addressed This Visit       Cardiovascular and Mediastinum   Internal hemorrhoid    Unsure of chronicity  Reports she has had two  episodes of painless rectal bleeding with bowel movements on Tuesday States she has had a bowel movement since without bleeding  Reviewed importance of increased fiber in diet to assist with constipation  Follow up as needed         Endocrine   Acquired hypothyroidism (Chronic)    Chronic, historic condition Will recheck TSH today Will send Levothyroxine script in once labs are returned Follow up in 6 months for monitoring       Relevant Orders   TSH   Other Visit Diagnoses     Painless rectal bleeding    -  Primary Acute, new problem Reports two episodes of painless rectal bleeding with bowel movements on Tuesday Reports she has been constipated due to taking Oxycodone following her right knee surgery She has a hx of aspirin use and Meloxicam use for some time prior to apt and knee surgery respectively Will check CMP and CBC - rule out anemia and blood loss Suspect this is due to internal hemorrhoids but will refer to GI if indicated by persistent symptoms or CBC results Follow up as needed   Relevant Orders   Comp Met (CMET)   CBC w/Diff        No follow-ups on file.   I, Liddie Chichester E Marlea Gambill, PA-C, have reviewed all documentation for this visit. The documentation on 03/12/22 for the exam, diagnosis, procedures, and orders are all accurate and complete.   Talitha Givens, MHS, PA-C [facility]

## 2022-03-12 NOTE — Telephone Encounter (Signed)
Previous Dr. Neomia Dear patient. Patient last seen in May for CPE. Has appointment today with Erin.

## 2022-03-13 LAB — CBC WITH DIFFERENTIAL/PLATELET
Basophils Absolute: 0.1 10*3/uL (ref 0.0–0.2)
Basos: 1 %
EOS (ABSOLUTE): 0.1 10*3/uL (ref 0.0–0.4)
Eos: 2 %
Hematocrit: 40.9 % (ref 34.0–46.6)
Hemoglobin: 14 g/dL (ref 11.1–15.9)
Immature Grans (Abs): 0 10*3/uL (ref 0.0–0.1)
Immature Granulocytes: 0 %
Lymphocytes Absolute: 1.5 10*3/uL (ref 0.7–3.1)
Lymphs: 27 %
MCH: 33.1 pg — ABNORMAL HIGH (ref 26.6–33.0)
MCHC: 34.2 g/dL (ref 31.5–35.7)
MCV: 97 fL (ref 79–97)
Monocytes Absolute: 0.5 10*3/uL (ref 0.1–0.9)
Monocytes: 9 %
Neutrophils Absolute: 3.3 10*3/uL (ref 1.4–7.0)
Neutrophils: 61 %
Platelets: 268 10*3/uL (ref 150–450)
RBC: 4.23 x10E6/uL (ref 3.77–5.28)
RDW: 13.7 % (ref 11.7–15.4)
WBC: 5.5 10*3/uL (ref 3.4–10.8)

## 2022-03-13 LAB — COMPREHENSIVE METABOLIC PANEL
ALT: 25 IU/L (ref 0–32)
AST: 20 IU/L (ref 0–40)
Albumin/Globulin Ratio: 1.8 (ref 1.2–2.2)
Albumin: 4.4 g/dL (ref 3.8–4.9)
Alkaline Phosphatase: 137 IU/L — ABNORMAL HIGH (ref 44–121)
BUN/Creatinine Ratio: 13 (ref 12–28)
BUN: 12 mg/dL (ref 8–27)
Bilirubin Total: 0.2 mg/dL (ref 0.0–1.2)
CO2: 22 mmol/L (ref 20–29)
Calcium: 9.5 mg/dL (ref 8.7–10.3)
Chloride: 103 mmol/L (ref 96–106)
Creatinine, Ser: 0.9 mg/dL (ref 0.57–1.00)
Globulin, Total: 2.5 g/dL (ref 1.5–4.5)
Glucose: 94 mg/dL (ref 70–99)
Potassium: 4.7 mmol/L (ref 3.5–5.2)
Sodium: 141 mmol/L (ref 134–144)
Total Protein: 6.9 g/dL (ref 6.0–8.5)
eGFR: 73 mL/min/{1.73_m2} (ref >59)

## 2022-03-13 LAB — TSH: TSH: 6.46 u[IU]/mL — ABNORMAL HIGH (ref 0.450–4.500)

## 2022-03-16 ENCOUNTER — Ambulatory Visit: Payer: Self-pay | Admitting: *Deleted

## 2022-03-16 ENCOUNTER — Other Ambulatory Visit: Payer: Self-pay

## 2022-03-16 DIAGNOSIS — Z111 Encounter for screening for respiratory tuberculosis: Secondary | ICD-10-CM

## 2022-03-16 DIAGNOSIS — Z0000 Encounter for general adult medical examination without abnormal findings: Secondary | ICD-10-CM

## 2022-03-16 NOTE — Telephone Encounter (Unsigned)
Copied from [place] [phone]. Topic: General - Other >> Mar 16, 2022  2:13 PM Everette C wrote: Reason for CRM: Medication Refill - Medication: amphetamine-dextroamphetamine (ADDERALL XR) 20 MG 24 hr capsule [[id number]]   levothyroxine (SYNTHROID) 100 MCG tablet [[id number]]   Has the patient contacted their pharmacy? Yes.  The patient has been directed to contact their PCP  (Agent: If no, request that the patient contact the pharmacy for the refill. If patient does not wish to contact the pharmacy document the reason why and proceed with request.) (Agent: If yes, when and what did the pharmacy advise?)  Preferred Pharmacy (with phone number or street name): [place], [place] [address] [place] [address] Phone: [phone] Fax: [phone] Hours: Not open 24 hours   Has the patient been seen for an appointment in the last year OR does the patient have an upcoming appointment? Yes.    Agent: Please be advised that RX refills may take up to 3 business days. We ask that you follow-up with your pharmacy.

## 2022-03-16 NOTE — Telephone Encounter (Signed)
Second voicemail left.

## 2022-03-16 NOTE — Telephone Encounter (Signed)
Have called twice in a row and pt answers and looses connection. Will forward to [facility]. Talitha Givens, PA saw pt 03/12/22, saw lab results and had notice on there questioning pt about if taking thyroid med. This note states she has been out and gone back to using old med of different strength. I have been unable to reach her all day. Please assess.

## 2022-03-16 NOTE — Telephone Encounter (Signed)
Summary: rx concern   The patient has been without their levothyroxine (SYNTHROID) 100 MCG tablet [[id number]] for roughly a month   The patient has been taking an old version of the medication   The patient been experiencing difficulty refilling their medication and is concerned with their general tiredness and sweating   Please contact further when possible     No answer, voicemail left.

## 2022-03-17 NOTE — Telephone Encounter (Signed)
Requested medication (s) are due for refill today: yes  Requested medication (s) are on the active medication list:yes  Last refill:  10/13/21 and 12/11/21  Future visit scheduled: no  Notes to clinic: Unable to refill per protocol, cannot delegate. Levothyroxine was refused  a week ago. Routing for approval.     Requested Prescriptions  Pending Prescriptions Disp Refills   amphetamine-dextroamphetamine (ADDERALL XR) 20 MG 24 hr capsule 30 capsule 0    Sig: Take 1 capsule (20 mg total) by mouth daily.     Not Delegated - Psychiatry:  Stimulants/ADHD Failed - 03/16/2022  2:39 PM      Failed - This refill cannot be delegated      Failed - Urine Drug Screen completed in last 360 days      Passed - Last BP in normal range    BP Readings from Last 1 Encounters:  03/12/22 119/81         Passed - Last Heart Rate in normal range    Pulse Readings from Last 1 Encounters:  03/12/22 75         Passed - Valid encounter within last 6 months    Recent Outpatient Visits           5 days ago Painless rectal bleeding   [facility] Mecum, Dani Gobble, PA-C   3 months ago Screening for tuberculosis   [place] Vigg, Avanti, MD   4 months ago Upper respiratory tract infection, unspecified type   [facility] Vigg, Avanti, MD   6 months ago Hypothyroidism, unspecified type   [place] Vigg, Avanti, MD   7 months ago Menopausal syndrome (hot flashes)   [place], Lauren A, NP               levothyroxine (SYNTHROID) 100 MCG tablet 90 tablet 0    Sig: Take 1 tablet (100 mcg total) by mouth daily.     Endocrinology:  Hypothyroid Agents Failed - 03/16/2022  2:39 PM      Failed - TSH in normal range and within 360 days    TSH  Date Value Ref Range Status  03/12/2022 6.460 (H) 0.450 - 4.500 uIU/mL Final         Passed - Valid encounter within last 12 months    Recent Outpatient Visits           5 days ago  Painless rectal bleeding   [facility] Mecum, Dani Gobble, PA-C   3 months ago Screening for tuberculosis   [place] Vigg, Avanti, MD   4 months ago Upper respiratory tract infection, unspecified type   [facility] Vigg, Avanti, MD   6 months ago Hypothyroidism, unspecified type   [facility] Vigg, Avanti, MD   7 months ago Menopausal syndrome (hot flashes)   [facility], Scheryl Darter, NP

## 2022-03-18 ENCOUNTER — Other Ambulatory Visit: Payer: Self-pay | Admitting: Physician Assistant

## 2022-03-18 DIAGNOSIS — E039 Hypothyroidism, unspecified: Secondary | ICD-10-CM

## 2022-03-18 MED ORDER — LEVOTHYROXINE SODIUM 100 MCG PO TABS: 100.0000 ug | ORAL_TABLET | Freq: Every day | ORAL | 1 refills | Status: DC

## 2022-03-18 MED ORDER — AMPHETAMINE-DEXTROAMPHET ER 20 MG PO CP24: 20.0000 mg | ORAL_CAPSULE | Freq: Every day | ORAL | 0 refills | Status: DC

## 2022-03-18 NOTE — Telephone Encounter (Signed)
Please schedule patient for a 3 month appt with me so I can continue to fill her adderall.

## 2022-03-18 NOTE — Telephone Encounter (Signed)
Patient scheduled 06/17/2022

## 2022-03-23 DIAGNOSIS — R600 Localized edema: Secondary | ICD-10-CM | POA: Diagnosis not present

## 2022-03-30 DIAGNOSIS — M1711 Unilateral primary osteoarthritis, right knee: Secondary | ICD-10-CM | POA: Diagnosis not present

## 2022-03-30 DIAGNOSIS — M25661 Stiffness of right knee, not elsewhere classified: Secondary | ICD-10-CM | POA: Diagnosis not present

## 2022-03-30 DIAGNOSIS — M25561 Pain in right knee: Secondary | ICD-10-CM | POA: Diagnosis not present

## 2022-04-08 ENCOUNTER — Other Ambulatory Visit: Payer: Self-pay | Admitting: Nurse Practitioner

## 2022-04-08 DIAGNOSIS — M25661 Stiffness of right knee, not elsewhere classified: Secondary | ICD-10-CM | POA: Diagnosis not present

## 2022-04-08 DIAGNOSIS — Z0000 Encounter for general adult medical examination without abnormal findings: Secondary | ICD-10-CM

## 2022-04-08 DIAGNOSIS — M25561 Pain in right knee: Secondary | ICD-10-CM | POA: Diagnosis not present

## 2022-04-08 DIAGNOSIS — Z111 Encounter for screening for respiratory tuberculosis: Secondary | ICD-10-CM

## 2022-04-08 DIAGNOSIS — M1711 Unilateral primary osteoarthritis, right knee: Secondary | ICD-10-CM | POA: Diagnosis not present

## 2022-04-08 MED ORDER — CITALOPRAM HYDROBROMIDE 20 MG PO TABS: 20.0000 mg | ORAL_TABLET | Freq: Every day | ORAL | 1 refills | Status: DC

## 2022-04-08 NOTE — Telephone Encounter (Signed)
LOV 03/12/22  Future appt 06/17/22

## 2022-04-10 ENCOUNTER — Telehealth: Payer: Self-pay | Admitting: Nurse Practitioner

## 2022-04-10 DIAGNOSIS — Z0289 Encounter for other administrative examinations: Secondary | ICD-10-CM

## 2022-04-10 NOTE — Telephone Encounter (Signed)
PT dropped off Health Examination Certificate for [employer] to be completed.  Will put in providers folder.  PT would like callback when completed.

## 2022-04-10 NOTE — Telephone Encounter (Signed)
Noted  

## 2022-04-15 NOTE — Telephone Encounter (Signed)
Patient scheduled for  04/16/22 at 11 AM for titers.

## 2022-04-16 ENCOUNTER — Other Ambulatory Visit: Payer: 59

## 2022-04-16 DIAGNOSIS — Z23 Encounter for immunization: Secondary | ICD-10-CM

## 2022-04-16 DIAGNOSIS — Z0289 Encounter for other administrative examinations: Secondary | ICD-10-CM

## 2022-04-17 ENCOUNTER — Ambulatory Visit (INDEPENDENT_AMBULATORY_CARE_PROVIDER_SITE_OTHER): Payer: 59

## 2022-04-17 DIAGNOSIS — Z23 Encounter for immunization: Secondary | ICD-10-CM

## 2022-04-17 LAB — MEASLES/MUMPS/RUBELLA IMMUNITY
MUMPS ABS, IGG: 109 AU/mL (ref >10.9)
RUBEOLA AB, IGG: >300 AU/mL (ref >16.4)
Rubella Antibodies, IGG: 4.7 index (ref >0.99)

## 2022-04-17 LAB — HEPATITIS B SURFACE ANTIBODY, QUANTITATIVE: Hepatitis B Surf Ab Quant: <3.1 m[IU]/mL — ABNORMAL LOW (ref >9.9)

## 2022-04-17 NOTE — Progress Notes (Signed)
Please let patient know that she will need a Hep B vaccine.  Her MMR looks good.  Please have her come in for a Hep B shot. I placed the order.

## 2022-04-21 DIAGNOSIS — M1711 Unilateral primary osteoarthritis, right knee: Secondary | ICD-10-CM | POA: Diagnosis not present

## 2022-04-21 DIAGNOSIS — M25561 Pain in right knee: Secondary | ICD-10-CM | POA: Diagnosis not present

## 2022-04-21 DIAGNOSIS — M25661 Stiffness of right knee, not elsewhere classified: Secondary | ICD-10-CM | POA: Diagnosis not present

## 2022-04-23 DIAGNOSIS — M25661 Stiffness of right knee, not elsewhere classified: Secondary | ICD-10-CM | POA: Diagnosis not present

## 2022-04-23 DIAGNOSIS — M1711 Unilateral primary osteoarthritis, right knee: Secondary | ICD-10-CM | POA: Diagnosis not present

## 2022-04-23 DIAGNOSIS — M25561 Pain in right knee: Secondary | ICD-10-CM | POA: Diagnosis not present

## 2022-04-28 ENCOUNTER — Telehealth: Payer: Self-pay | Admitting: Nurse Practitioner

## 2022-04-28 DIAGNOSIS — M1711 Unilateral primary osteoarthritis, right knee: Secondary | ICD-10-CM | POA: Diagnosis not present

## 2022-04-28 DIAGNOSIS — M25661 Stiffness of right knee, not elsewhere classified: Secondary | ICD-10-CM | POA: Diagnosis not present

## 2022-04-28 DIAGNOSIS — M25561 Pain in right knee: Secondary | ICD-10-CM | POA: Diagnosis not present

## 2022-04-28 NOTE — Telephone Encounter (Signed)
Pt called and says she missed a call from the office, says she is waiting for her form completion. Please advise

## 2022-04-28 NOTE — Telephone Encounter (Signed)
Called patient back and informed her that her lab visit was to recheck her TSH levels. Patient also wanted to know if the paperwork that she dropped off on 9/1 was completed yet.

## 2022-04-28 NOTE — Telephone Encounter (Signed)
Patient notified paperwork is ready to be picked up. Pt voiced understanding.

## 2022-04-28 NOTE — Telephone Encounter (Signed)
Copied from [place] [phone]. Topic: General - Call Back - No Documentation >> Apr 28, 2022  3:38 PM Oley Balm E wrote: Reason for CRM: Pt wants to know what her lab appt is for?  Best contact: [phone]

## 2022-04-29 NOTE — Telephone Encounter (Signed)
Yes, spoke with patient yesterday- I think there a telephone encounter, but patient states she will come Friday to pick up paperwork since she needs to come for lab draw.

## 2022-04-30 DIAGNOSIS — M25561 Pain in right knee: Secondary | ICD-10-CM | POA: Diagnosis not present

## 2022-04-30 DIAGNOSIS — M1711 Unilateral primary osteoarthritis, right knee: Secondary | ICD-10-CM | POA: Diagnosis not present

## 2022-04-30 DIAGNOSIS — M25661 Stiffness of right knee, not elsewhere classified: Secondary | ICD-10-CM | POA: Diagnosis not present

## 2022-05-01 ENCOUNTER — Other Ambulatory Visit: Payer: 59

## 2022-05-01 DIAGNOSIS — E039 Hypothyroidism, unspecified: Secondary | ICD-10-CM | POA: Diagnosis not present

## 2022-05-02 LAB — TSH: TSH: 4.49 u[IU]/mL (ref 0.450–4.500)

## 2022-05-05 DIAGNOSIS — M1711 Unilateral primary osteoarthritis, right knee: Secondary | ICD-10-CM | POA: Diagnosis not present

## 2022-05-05 DIAGNOSIS — M25661 Stiffness of right knee, not elsewhere classified: Secondary | ICD-10-CM | POA: Diagnosis not present

## 2022-05-05 DIAGNOSIS — M25561 Pain in right knee: Secondary | ICD-10-CM | POA: Diagnosis not present

## 2022-05-13 ENCOUNTER — Other Ambulatory Visit: Payer: Self-pay | Admitting: Physician Assistant

## 2022-05-13 DIAGNOSIS — E039 Hypothyroidism, unspecified: Secondary | ICD-10-CM

## 2022-05-14 NOTE — Telephone Encounter (Signed)
Requested Prescriptions  Pending Prescriptions Disp Refills  . levothyroxine (SYNTHROID) 100 MCG tablet [Pharmacy Med Name: Levothyroxine Sodium 100 MCG Oral Tablet] 90 tablet 0    Sig: Take 1 tablet by mouth once daily     Endocrinology:  Hypothyroid Agents Passed - 05/13/2022  9:55 PM      Passed - TSH in normal range and within 360 days    TSH  Date Value Ref Range Status  05/01/2022 4.490 0.450 - 4.500 uIU/mL Final         Passed - Valid encounter within last 12 months    Recent Outpatient Visits          2 months ago Painless rectal bleeding   [facility] Mecum, Erin E, PA-C   5 months ago Screening for tuberculosis   [place] Vigg, Avanti, MD   6 months ago Upper respiratory tract infection, unspecified type   [place] Vigg, Avanti, MD   8 months ago Hypothyroidism, unspecified type   [facility] Vigg, Avanti, MD   9 months ago Menopausal syndrome (hot flashes)   [place], Scheryl Darter, NP      Future Appointments            In 1 month Jon Billings, NP [place], [place]

## 2022-05-27 ENCOUNTER — Other Ambulatory Visit: Payer: Self-pay | Admitting: Nurse Practitioner

## 2022-05-27 DIAGNOSIS — Z111 Encounter for screening for respiratory tuberculosis: Secondary | ICD-10-CM

## 2022-05-27 DIAGNOSIS — Z0000 Encounter for general adult medical examination without abnormal findings: Secondary | ICD-10-CM

## 2022-05-27 NOTE — Telephone Encounter (Signed)
Requested medication (s) are due for refill today: yes  Requested medication (s) are on the active medication list: yes  Last refill:  03/18/22 #30/0  Future visit scheduled: yes  Notes to clinic:  Unable to refill per protocol, cannot delegate.    Requested Prescriptions  Pending Prescriptions Disp Refills   amphetamine-dextroamphetamine (ADDERALL XR) 20 MG 24 hr capsule 30 capsule 0    Sig: Take 1 capsule (20 mg total) by mouth daily.     Not Delegated - Psychiatry:  Stimulants/ADHD Failed - 05/27/2022  4:29 PM      Failed - This refill cannot be delegated      Failed - Urine Drug Screen completed in last 360 days      Passed - Last BP in normal range    BP Readings from Last 1 Encounters:  03/12/22 119/81         Passed - Last Heart Rate in normal range    Pulse Readings from Last 1 Encounters:  03/12/22 75         Passed - Valid encounter within last 6 months    Recent Outpatient Visits           2 months ago Painless rectal bleeding   [facility] Mecum, Dani Gobble, PA-C   5 months ago Screening for tuberculosis   [place] Vigg, Avanti, MD   6 months ago Upper respiratory tract infection, unspecified type   [place] Vigg, Avanti, MD   8 months ago Hypothyroidism, unspecified type   [facility] Vigg, Avanti, MD   10 months ago Menopausal syndrome (hot flashes)   [place], Scheryl Darter, NP       Future Appointments             In 3 weeks Jon Billings, NP [place], [place]

## 2022-05-27 NOTE — Telephone Encounter (Signed)
Medication Refill - Medication: amphetamine-dextroamphetamine (ADDERALL XR) 20 MG 24 hr capsule   Has the patient contacted their pharmacy? Yes.   (Agent: If no, request that the patient contact the pharmacy for the refill. If patient does not wish to contact the pharmacy document the reason why and proceed with request.) (Agent: If yes, when and what did the pharmacy advise?) call pcp   Preferred Pharmacy (with phone number or street name): [facility] [address], [place]  Has the patient been seen for an appointment in the last year OR does the patient have an upcoming appointment? Yes.    Agent: Please be advised that RX refills may take up to 3 business days. We ask that you follow-up with your pharmacy.

## 2022-05-28 MED ORDER — AMPHETAMINE-DEXTROAMPHET ER 20 MG PO CP24: 20.0000 mg | ORAL_CAPSULE | Freq: Every day | ORAL | 0 refills | Status: DC

## 2022-05-28 NOTE — Telephone Encounter (Signed)
Last seen on 03/18/2022 by Talitha Givens PA, rx sent on 03/18/2022. Next follow up scheduled for 06/17/2022. Please advise.

## 2022-06-08 DIAGNOSIS — M10071 Idiopathic gout, right ankle and foot: Secondary | ICD-10-CM | POA: Diagnosis not present

## 2022-06-08 DIAGNOSIS — M79674 Pain in right toe(s): Secondary | ICD-10-CM | POA: Diagnosis not present

## 2022-06-08 DIAGNOSIS — Z79899 Other long term (current) drug therapy: Secondary | ICD-10-CM | POA: Diagnosis not present

## 2022-06-08 DIAGNOSIS — B351 Tinea unguium: Secondary | ICD-10-CM | POA: Diagnosis not present

## 2022-06-16 NOTE — Progress Notes (Unsigned)
   There were no vitals taken for this visit.   Subjective:    Patient ID: Jennifer Whitney, female    DOB: 15-Aug-1960, 61 y.o.   MRN: [id number]  HPI: Jennifer Whitney is a 61 y.o. female  No chief complaint on file.  ADHD FOLLOW UP ADHD status: {Blank single:19197::"controlled","uncontrolled","better","worse","exacerbated","stable"} Satisfied with current therapy: {Blank single:19197::"yes","no"} Medication compliance:  {Blank single:19197::"excellent compliance","good compliance","fair compliance","poor compliance"} Controlled substance contract: {Blank single:19197::"yes","no"} Previous psychiatry evaluation: {Blank single:19197::"yes","no"} Previous medications: {Blank single:19197::"yes","no"} {Blank FUXNATFT:73220::"URKYHCWC","BJSEGBTD XR","concerta","daytrana (methylphenidate)","focalin (dexamethylphenidate)". "ritalin","ritalin LA","ritalin SR","stratera (atomoxetine)","vyvanse (lisdexamfethamine)","wellbutrin"}   Taking meds on weekends/vacations: {Blank single:19197::"yes","no","occasionally"} Work/school performance:  {Blank single:19197::"excellent","good","average","fair","poor"} Difficulty sustaining attention/completing tasks: {Blank single:19197::"yes","no"} Distracted by extraneous stimuli: {Blank single:19197::"yes","no"} Does not listen when spoken to: {Blank single:19197::"yes","no"}  Fidgets with hands or feet: {Blank single:19197::"yes","no"} Unable to stay in seat: {Blank single:19197::"yes","no"} Blurts out/interrupts others: {Blank single:19197::"yes","no"} ADHD Medication Side Effects: {Blank single:19197::"yes","no"}    Decreased appetite: {Blank single:19197::"yes","no"}    Headache: {Blank single:19197::"yes","no"}    Sleeping disturbance pattern: {Blank single:19197::"yes","no"}    Irritability: {Blank single:19197::"yes","no"}    Rebound effects (worse than baseline) off medication: {Blank single:19197::"yes","no"}    Anxiousness: {Blank  single:19197::"yes","no"}    Dizziness: {Blank single:19197::"yes","no"}    Tics: {Blank single:19197::"yes","no"}  Relevant past medical, surgical, family and social history reviewed and updated as indicated. Interim medical history since our last visit reviewed. Allergies and medications reviewed and updated.  Review of Systems  Per HPI unless specifically indicated above     Objective:    There were no vitals taken for this visit.  Wt Readings from Last 3 Encounters:  03/12/22 208 lb 8 oz (94.6 kg)  01/29/22 212 lb 4.8 oz (96.3 kg)  01/16/22 212 lb 4.8 oz (96.3 kg)    Physical Exam  Results for orders placed or performed in visit on 05/01/22  TSH  Result Value Ref Range   TSH 4.490 0.450 - 4.500 uIU/mL      Assessment & Plan:   Problem List Items Addressed This Visit   None    Follow up plan: No follow-ups on file.

## 2022-06-17 ENCOUNTER — Encounter: Payer: Self-pay | Admitting: Nurse Practitioner

## 2022-06-17 ENCOUNTER — Ambulatory Visit (INDEPENDENT_AMBULATORY_CARE_PROVIDER_SITE_OTHER): Payer: 59 | Admitting: Nurse Practitioner

## 2022-06-17 VITALS — BP 124/88 | HR 83 | Temp 98.4°F | Wt 208.5 lb

## 2022-06-17 DIAGNOSIS — R69 Illness, unspecified: Secondary | ICD-10-CM | POA: Diagnosis not present

## 2022-06-17 DIAGNOSIS — F909 Attention-deficit hyperactivity disorder, unspecified type: Secondary | ICD-10-CM

## 2022-06-17 MED ORDER — AMPHETAMINE-DEXTROAMPHET ER 30 MG PO CP24: 30.0000 mg | ORAL_CAPSULE | Freq: Every day | ORAL | 0 refills | Status: DC

## 2022-06-17 MED ORDER — AMPHETAMINE-DEXTROAMPHET ER 20 MG PO CP24
20.0000 mg | ORAL_CAPSULE | Freq: Every day | ORAL | 0 refills | Status: DC
Start: 2022-06-17 — End: 2022-06-17

## 2022-06-17 NOTE — Assessment & Plan Note (Signed)
Chronic. Not well controlled.  Will increase Adderall to '30mg'$  daily.  Controlled substance agreement and Uds obtained today.  Refills sent for 3 months.  Follow up in 3 months.  Call sooner if concerns arise.

## 2022-06-21 LAB — DRUG PROFILE 799016
Amphetamine GC/MS Conf: >3000 ng/mL
Amphetamine: POSITIVE — AB
Amphetamines: POSITIVE — AB
Methamphetamine: NEGATIVE

## 2022-06-21 LAB — DRUG SCREEN 764883 11+OXYCO+ALC+CRT-BUND
BENZODIAZ UR QL: NEGATIVE ng/mL
Barbiturate: NEGATIVE ng/mL
Cannabinoid Quant, Ur: NEGATIVE ng/mL
Cocaine (Metabolite): NEGATIVE ng/mL
Creatinine: 121.4 mg/dL (ref 20.0–300.0)
Ethanol: NEGATIVE %
Meperidine: NEGATIVE ng/mL
Methadone Screen, Urine: NEGATIVE ng/mL
OPIATE SCREEN URINE: NEGATIVE ng/mL
Oxycodone/Oxymorphone, Urine: NEGATIVE ng/mL
Phencyclidine: NEGATIVE ng/mL
Propoxyphene: NEGATIVE ng/mL
Tramadol: NEGATIVE ng/mL
pH, Urine: 5.6 (ref 4.5–8.9)

## 2022-06-22 NOTE — Progress Notes (Signed)
Hi Jennifer Whitney . Your drug screen looks good.  See you at our next visit.

## 2022-07-20 ENCOUNTER — Other Ambulatory Visit: Payer: Self-pay | Admitting: Physician Assistant

## 2022-07-20 DIAGNOSIS — B351 Tinea unguium: Secondary | ICD-10-CM | POA: Diagnosis not present

## 2022-07-20 DIAGNOSIS — Z79899 Other long term (current) drug therapy: Secondary | ICD-10-CM | POA: Diagnosis not present

## 2022-07-20 DIAGNOSIS — E039 Hypothyroidism, unspecified: Secondary | ICD-10-CM

## 2022-09-14 ENCOUNTER — Other Ambulatory Visit: Payer: Self-pay | Admitting: Nurse Practitioner

## 2022-09-14 NOTE — Telephone Encounter (Signed)
Medication Refill - Medication:   Disp Refills Start End   amphetamine-dextroamphetamine (ADDERALL XR) 30 MG 24 hr capsule         Has the patient contacted their pharmacy? Yes.   (Agent: If no, request that the patient contact the pharmacy for the refill. If patient does not wish to contact the pharmacy document the reason why and proceed with request.) (Agent: If yes, when and what did the pharmacy advise?) call dr  Preferred Pharmacy (with phone number or street name):  [facility], [address] [place] Phone: [phone]  Fax: [phone]     Has the patient been seen for an appointment in the last year OR does the patient have an upcoming appointment? Yes.    Agent: Please be advised that RX refills may take up to 3 business days. We ask that you follow-up with your pharmacy.

## 2022-09-15 MED ORDER — AMPHETAMINE-DEXTROAMPHET ER 30 MG PO CP24: 30.0000 mg | ORAL_CAPSULE | Freq: Every day | ORAL | 0 refills | Status: DC

## 2022-09-15 NOTE — Telephone Encounter (Signed)
Requested medication (s) are due for refill today: yes  Requested medication (s) are on the active medication list: yes  Last refill:  08/17/22 #30/0  Future visit scheduled: yes  Notes to clinic:  Unable to refill per protocol, cannot delegate.    Requested Prescriptions  Pending Prescriptions Disp Refills   amphetamine-dextroamphetamine (ADDERALL XR) 30 MG 24 hr capsule 30 capsule 0    Sig: Take 1 capsule (30 mg total) by mouth daily.     Not Delegated - Psychiatry:  Stimulants/ADHD Failed - 09/14/2022 12:05 PM      Failed - This refill cannot be delegated      Passed - Urine Drug Screen completed in last 360 days      Passed - Last BP in normal range    BP Readings from Last 1 Encounters:  06/17/22 124/88         Passed - Last Heart Rate in normal range    Pulse Readings from Last 1 Encounters:  06/17/22 83         Passed - Valid encounter within last 6 months    Recent Outpatient Visits           3 months ago Attention deficit hyperactivity disorder (ADHD), unspecified ADHD type   [place], NP   6 months ago Painless rectal bleeding   [place] [facility] Mecum, Dani Gobble, PA-C   9 months ago Screening for tuberculosis   [place] Vigg, Avanti, MD   10 months ago Upper respiratory tract infection, unspecified type   [place] Vigg, Avanti, MD   1 year ago Hypothyroidism, unspecified type   [place] Charlynne Cousins, MD       Future Appointments             In 1 week Jon Billings, NP [place], [facility]

## 2022-09-15 NOTE — Telephone Encounter (Signed)
Will run out before next appointment.

## 2022-09-17 ENCOUNTER — Ambulatory Visit: Payer: 59 | Admitting: Nurse Practitioner

## 2022-09-23 NOTE — Progress Notes (Unsigned)
There were no vitals taken for this visit.   Subjective:    Patient ID: Jennifer Whitney, female    DOB: [birth year], 62 y.o.   MRN: [id number]  HPI: Jennifer Whitney is a 62 y.o. female  No chief complaint on file.  ADHD FOLLOW UP ADHD status:  Feels like she is not as focused as she was before Satisfied with current therapy: no Medication compliance:  excellent compliance Controlled substance contract: yes Previous psychiatry evaluation: yes Previous medications: yes adderall XR   Taking meds on weekends/vacations: occasionally Work/school performance:  good Difficulty sustaining attention/completing tasks: yes Distracted by extraneous stimuli: yes Does not listen when spoken to: yes  Fidgets with hands or feet: yes Unable to stay in seat: no Blurts out/interrupts others: yes ADHD Medication Side Effects: no    Decreased appetite: no    Headache: no    Sleeping disturbance pattern: no    Irritability: no    Rebound effects (worse than baseline) off medication: no    Anxiousness: no    Dizziness: no    Tics: no  Relevant past medical, surgical, family and social history reviewed and updated as indicated. Interim medical history since our last visit reviewed. Allergies and medications reviewed and updated.  Review of Systems  Constitutional:  Negative for appetite change.  Neurological:  Negative for dizziness.  Psychiatric/Behavioral:  Positive for decreased concentration. Negative for agitation and sleep disturbance. The patient is not nervous/anxious.     Per HPI unless specifically indicated above     Objective:    There were no vitals taken for this visit.  Wt Readings from Last 3 Encounters:  06/17/22 208 lb 8 oz (94.6 kg)  03/12/22 208 lb 8 oz (94.6 kg)  01/29/22 212 lb 4.8 oz (96.3 kg)    Physical Exam Vitals and nursing note reviewed.  Constitutional:      General: She is not in acute distress.    Appearance: Normal appearance. She is normal weight.  She is not ill-appearing, toxic-appearing or diaphoretic.  HENT:     Head: Normocephalic.     Right Ear: External ear normal.     Left Ear: External ear normal.     Nose: Nose normal.     Mouth/Throat:     Mouth: Mucous membranes are moist.     Pharynx: Oropharynx is clear.  Eyes:     General:        Right eye: No discharge.        Left eye: No discharge.     Extraocular Movements: Extraocular movements intact.     Conjunctiva/sclera: Conjunctivae normal.     Pupils: Pupils are equal, round, and reactive to light.  Cardiovascular:     Rate and Rhythm: Normal rate and regular rhythm.     Heart sounds: No murmur heard. Pulmonary:     Effort: Pulmonary effort is normal. No respiratory distress.     Breath sounds: Normal breath sounds. No wheezing or rales.  Musculoskeletal:     Cervical back: Normal range of motion and neck supple.  Skin:    General: Skin is warm and dry.     Capillary Refill: Capillary refill takes less than 2 seconds.  Neurological:     General: No focal deficit present.     Mental Status: She is alert and oriented to person, place, and time. Mental status is at baseline.  Psychiatric:        Mood and Affect: Mood normal.  Behavior: Behavior normal.        Thought Content: Thought content normal.        Judgment: Judgment normal.    Results for orders placed or performed in visit on 06/17/22  [id number] 11+Oxyco+Alc+Crt-Bund  Result Value Ref Range   Ethanol Negative Cutoff=0.020 %   Amphetamines, Urine See Final Results Cutoff=1000 ng/mL   Barbiturate Negative Cutoff=200 ng/mL   BENZODIAZ UR QL Negative Cutoff=200 ng/mL   Cannabinoid Quant, Ur Negative Cutoff=50 ng/mL   Cocaine (Metabolite) Negative Cutoff=300 ng/mL   OPIATE SCREEN URINE Negative Cutoff=300 ng/mL   Oxycodone/Oxymorphone, Urine Negative Cutoff=300 ng/mL   Phencyclidine Negative Cutoff=25 ng/mL   Methadone Screen, Urine Negative Cutoff=300 ng/mL   Propoxyphene Negative Cutoff=300  ng/mL   Meperidine Negative Cutoff=200 ng/mL   Tramadol Negative Cutoff=200 ng/mL   Creatinine 121.4 20.0 - 300.0 mg/dL   pH, Urine 5.6 4.5 - 8.9  Drug Profile [phone]  Result Value Ref Range   Amphetamines Positive (A) Cutoff=1000   Amphetamine Positive (A)    Amphetamine GC/MS Conf >3000 Cutoff=500 ng/mL   Methamphetamine Negative Cutoff=500      Assessment & Plan:   Problem List Items Addressed This Visit   None    Follow up plan: No follow-ups on file.

## 2022-09-24 ENCOUNTER — Ambulatory Visit: Payer: 59 | Admitting: Nurse Practitioner

## 2022-09-24 ENCOUNTER — Encounter: Payer: Self-pay | Admitting: Nurse Practitioner

## 2022-09-24 VITALS — BP 130/79 | HR 69 | Temp 98.1°F | Wt 206.8 lb

## 2022-09-24 DIAGNOSIS — R0981 Nasal congestion: Secondary | ICD-10-CM | POA: Diagnosis not present

## 2022-09-24 DIAGNOSIS — F909 Attention-deficit hyperactivity disorder, unspecified type: Secondary | ICD-10-CM | POA: Diagnosis not present

## 2022-09-24 DIAGNOSIS — R69 Illness, unspecified: Secondary | ICD-10-CM | POA: Diagnosis not present

## 2022-09-24 MED ORDER — AMPHETAMINE-DEXTROAMPHET ER 30 MG PO CP24: 30.0000 mg | ORAL_CAPSULE | Freq: Every day | ORAL | 0 refills | Status: DC

## 2022-09-24 MED ORDER — AMOXICILLIN 500 MG PO CAPS: 500.0000 mg | ORAL_CAPSULE | Freq: Two times a day (BID) | ORAL | 0 refills | Status: AC

## 2022-09-24 NOTE — Assessment & Plan Note (Signed)
Chronic. Controlled.  Will continue Adderall to 19m daily.  Controlled substance agreement and Uds up to date.  Refills sent for 3 months.  Follow up in 3 months.  Call sooner if concerns arise.

## 2022-09-25 LAB — NOVEL CORONAVIRUS, NAA: SARS-CoV-2, NAA: NOT DETECTED

## 2022-09-25 LAB — SPECIMEN STATUS REPORT

## 2022-09-28 NOTE — Progress Notes (Signed)
Hi Jennifer Whitney. Your COVID test was negative.

## 2022-10-12 ENCOUNTER — Other Ambulatory Visit: Payer: Self-pay | Admitting: Nurse Practitioner

## 2022-10-12 DIAGNOSIS — E039 Hypothyroidism, unspecified: Secondary | ICD-10-CM

## 2022-10-13 NOTE — Telephone Encounter (Signed)
Requested Prescriptions  Pending Prescriptions Disp Refills   levothyroxine (SYNTHROID) 100 MCG tablet [Pharmacy Med Name: Levothyroxine Sodium 100 MCG Oral Tablet] 90 tablet 1    Sig: Take 1 tablet by mouth once daily     Endocrinology:  Hypothyroid Agents Passed - 10/12/2022  6:36 PM      Passed - TSH in normal range and within 360 days    TSH  Date Value Ref Range Status  05/01/2022 4.490 0.450 - 4.500 uIU/mL Final         Passed - Valid encounter within last 12 months    Recent Outpatient Visits           2 weeks ago Congestion of nasal sinus   [place], NP   3 months ago Attention deficit hyperactivity disorder (ADHD), unspecified ADHD type   [place], NP   7 months ago Painless rectal bleeding   [place] [facility] Mecum, Dani Gobble, PA-C   10 months ago Screening for tuberculosis   [place] V[place], Avanti, MD   11 months ago Upper respiratory tract infection, unspecified type   [place] Charlynne Cousins, MD       Future Appointments             In 2 months Jon Billings, NP [place], [place]

## 2022-11-11 ENCOUNTER — Other Ambulatory Visit: Payer: Self-pay | Admitting: Nurse Practitioner

## 2022-11-11 NOTE — Telephone Encounter (Signed)
Please refuse, RX is already at the pharmacy.  

## 2022-11-12 ENCOUNTER — Other Ambulatory Visit: Payer: Self-pay | Admitting: Nurse Practitioner

## 2022-11-12 DIAGNOSIS — Z0000 Encounter for general adult medical examination without abnormal findings: Secondary | ICD-10-CM

## 2022-11-12 DIAGNOSIS — E039 Hypothyroidism, unspecified: Secondary | ICD-10-CM

## 2022-11-12 DIAGNOSIS — Z111 Encounter for screening for respiratory tuberculosis: Secondary | ICD-10-CM

## 2022-11-12 MED ORDER — CITALOPRAM HYDROBROMIDE 20 MG PO TABS: 20.0000 mg | ORAL_TABLET | Freq: Every day | ORAL | 0 refills | Status: DC

## 2022-11-12 NOTE — Telephone Encounter (Signed)
[place] called and spoke to [place], [facility] about the refill(s) levothyroxine requested. Advised it was sent on 10/13/22 #90/1 refill(s). She says the insurance will only cover 30 days at a time and the patient picked up a 30 day supply on 10/25/22 and is due for another refill of 30 days on 11/18/22. She says the cost for a 90 day out of pocket is $10. Patient called to advise of the above, left a VM to return the call to the office. If she returns the call, let her know about this levothyroxine refill.

## 2022-11-12 NOTE — Telephone Encounter (Signed)
Requested Prescriptions  Pending Prescriptions Disp Refills   citalopram (CELEXA) 20 MG tablet 30 tablet 0    Sig: Take 1 tablet (20 mg total) by mouth daily.     Psychiatry:  Antidepressants - SSRI Passed - 11/12/2022 12:05 PM      Passed - Completed PHQ-2 or PHQ-9 in the last 360 days      Passed - Valid encounter within last 6 months    Recent Outpatient Visits           1 month ago Congestion of nasal sinus   [place], NP   4 months ago Attention deficit hyperactivity disorder (ADHD), unspecified ADHD type   [place], NP   8 months ago Painless rectal bleeding   [place] [facility] Mecum, Dani Gobble, PA-C   11 months ago Screening for tuberculosis   [place] Vigg, Avanti, MD   1 year ago Upper respiratory tract infection, unspecified type   [place] Charlynne Cousins, MD       Future Appointments             In 1 month Jon Billings, NP [place], [place]

## 2022-11-12 NOTE — Telephone Encounter (Signed)
Pt called to report that she is almost out of all of her medications, she only received 30 quantity for her levothyroxine instead of the prescribed 90.   Medication Refill - Medication: citalopram (CELEXA) 20 MG tablet  levothyroxine (SYNTHROID) 100 MCG tablet   Has the patient contacted their pharmacy? Yes.   (Agent: If no, request that the patient contact the pharmacy for the refill. If patient does not wish to contact the pharmacy document the reason why and proceed with request.) (Agent: If yes, when and what did the pharmacy advise?)  Preferred Pharmacy (with phone number or street name):  [place], [place] - [address]  [place] [address]  Phone: [phone] Fax: [phone]   Has the patient been seen for an appointment in the last year OR does the patient have an upcoming appointment? Yes.    Agent: Please be advised that RX refills may take up to 3 business days. We ask that you follow-up with your pharmacy.

## 2022-11-16 ENCOUNTER — Other Ambulatory Visit: Payer: Self-pay | Admitting: Nurse Practitioner

## 2022-11-16 NOTE — Telephone Encounter (Signed)
Requested medication (s) are due for refill today: no  Requested medication (s) are on the active medication list: yes  Last refill:  10/23/22 #30/0, 11/23/22 #30/0  Future visit scheduled: yes  Notes to clinic:  Unable to refill per protocol, cannot delegate.    Requested Prescriptions  Pending Prescriptions Disp Refills   amphetamine-dextroamphetamine (ADDERALL XR) 30 MG 24 hr capsule 30 capsule 0    Sig: Take 1 capsule (30 mg total) by mouth daily.     Not Delegated - Psychiatry:  Stimulants/ADHD Failed - 11/16/2022 11:02 AM      Failed - This refill cannot be delegated      Passed - Urine Drug Screen completed in last 360 days      Passed - Last BP in normal range    BP Readings from Last 1 Encounters:  09/24/22 130/79         Passed - Last Heart Rate in normal range    Pulse Readings from Last 1 Encounters:  09/24/22 69         Passed - Valid encounter within last 6 months    Recent Outpatient Visits           1 month ago Congestion of nasal sinus   [place] [facility] Larae Grooms, NP   5 months ago Attention deficit hyperactivity disorder (ADHD), unspecified ADHD type   [place] [facility] Larae Grooms, NP   8 months ago Painless rectal bleeding   [place] [facility] Mecum, Oswaldo Conroy, PA-C   11 months ago Screening for tuberculosis   [place] [facility] Vigg, Avanti, MD   1 year ago Upper respiratory tract infection, unspecified type   [place] [facility] Loura Pardon, MD       Future Appointments             In 1 month Larae Grooms, NP [facility]

## 2022-11-16 NOTE — Telephone Encounter (Signed)
Medication Refill - Medication: amphetamine-dextroamphetamine (ADDERALL XR) 30 MG 24 hr capsule   Has the patient contacted their pharmacy? Yes.     Preferred Pharmacy (with phone number or street name):  [facility] [address], [place] - [address] Phone: [phone]  Fax: [phone]     Has the patient been seen for an appointment in the last year OR does the patient have an upcoming appointment? Yes.    Previous refill on 09/24/22 was not picked up in time and so she needs a refill called in. Please assist patient further

## 2022-11-17 NOTE — Telephone Encounter (Signed)
Please refuse, RX is already at the pharmacy.  

## 2022-12-13 ENCOUNTER — Other Ambulatory Visit: Payer: Self-pay | Admitting: Nurse Practitioner

## 2022-12-13 DIAGNOSIS — Z111 Encounter for screening for respiratory tuberculosis: Secondary | ICD-10-CM

## 2022-12-13 DIAGNOSIS — Z0000 Encounter for general adult medical examination without abnormal findings: Secondary | ICD-10-CM

## 2022-12-14 NOTE — Telephone Encounter (Signed)
Requested Prescriptions  Pending Prescriptions Disp Refills   citalopram (CELEXA) 20 MG tablet [Pharmacy Med Name: Citalopram Hydrobromide 20 MG Oral Tablet] 90 tablet 1    Sig: Take 1 tablet by mouth once daily     Psychiatry:  Antidepressants - SSRI Passed - 12/13/2022 12:29 PM      Passed - Completed PHQ-2 or PHQ-9 in the last 360 days      Passed - Valid encounter within last 6 months    Recent Outpatient Visits           2 months ago Congestion of nasal sinus   [place] [facility] Larae Grooms, NP   6 months ago Attention deficit hyperactivity disorder (ADHD), unspecified ADHD type   [place] [facility] Larae Grooms, NP   9 months ago Painless rectal bleeding   [place] [facility] Mecum, Oswaldo Conroy, PA-C   1 year ago Screening for tuberculosis   [place] [facility] Vigg, Avanti, MD   1 year ago Upper respiratory tract infection, unspecified type   [place] [facility] Loura Pardon, MD       Future Appointments             In 1 week Larae Grooms, NP  [facility], [facility]

## 2022-12-23 ENCOUNTER — Ambulatory Visit: Payer: 59 | Admitting: Nurse Practitioner

## 2022-12-23 ENCOUNTER — Encounter: Payer: Self-pay | Admitting: Nurse Practitioner

## 2022-12-23 VITALS — BP 152/99 | HR 86 | Temp 98.3°F | Wt 209.5 lb

## 2022-12-23 DIAGNOSIS — R8290 Unspecified abnormal findings in urine: Secondary | ICD-10-CM | POA: Diagnosis not present

## 2022-12-23 DIAGNOSIS — Z0000 Encounter for general adult medical examination without abnormal findings: Secondary | ICD-10-CM | POA: Diagnosis not present

## 2022-12-23 DIAGNOSIS — E039 Hypothyroidism, unspecified: Secondary | ICD-10-CM

## 2022-12-23 DIAGNOSIS — Z23 Encounter for immunization: Secondary | ICD-10-CM

## 2022-12-23 DIAGNOSIS — E669 Obesity, unspecified: Secondary | ICD-10-CM | POA: Diagnosis not present

## 2022-12-23 DIAGNOSIS — E782 Mixed hyperlipidemia: Secondary | ICD-10-CM | POA: Diagnosis not present

## 2022-12-23 DIAGNOSIS — Z1231 Encounter for screening mammogram for malignant neoplasm of breast: Secondary | ICD-10-CM | POA: Diagnosis not present

## 2022-12-23 DIAGNOSIS — R030 Elevated blood-pressure reading, without diagnosis of hypertension: Secondary | ICD-10-CM

## 2022-12-23 DIAGNOSIS — F909 Attention-deficit hyperactivity disorder, unspecified type: Secondary | ICD-10-CM | POA: Diagnosis not present

## 2022-12-23 DIAGNOSIS — F325 Major depressive disorder, single episode, in full remission: Secondary | ICD-10-CM

## 2022-12-23 DIAGNOSIS — E66811 Obesity, class 1: Secondary | ICD-10-CM

## 2022-12-23 LAB — MICROSCOPIC EXAMINATION: Bacteria, UA: NONE SEEN

## 2022-12-23 LAB — URINALYSIS, ROUTINE W REFLEX MICROSCOPIC
Bilirubin, UA: NEGATIVE
Glucose, UA: NEGATIVE
Ketones, UA: NEGATIVE
Nitrite, UA: NEGATIVE
Protein,UA: NEGATIVE
RBC, UA: NEGATIVE
Specific Gravity, UA: 1.025 (ref 1.005–1.030)
Urobilinogen, Ur: 0.2 mg/dL (ref 0.2–1.0)
pH, UA: 6.5 (ref 5.0–7.5)

## 2022-12-23 MED ORDER — AMPHETAMINE-DEXTROAMPHETAMINE 30 MG PO TABS: 30.0000 mg | ORAL_TABLET | Freq: Every day | ORAL | 0 refills | Status: DC

## 2022-12-23 NOTE — Assessment & Plan Note (Signed)
Labs ordered at visit today.  Will make recommendations based on lab results.   

## 2022-12-23 NOTE — Assessment & Plan Note (Signed)
Chronic.  Ongoing concern.  Has been on Adderall 30mg  XR.  Now having issues with sleep.  Will change to Adderall regular release.  If eating concern has not improved at next visit, can consider changing to vyvanse to help with appetite control.  Follow up in 1 month.  Call sooner if concerns arise.

## 2022-12-23 NOTE — Assessment & Plan Note (Signed)
Chronic.  Controlled.  Continue with current medication regimen of Celexa 20mg  daily.  Labs ordered today.  Return to clinic in 6 months for reevaluation.  Call sooner if concerns arise.

## 2022-12-23 NOTE — Progress Notes (Signed)
BP (!) 152/99   Pulse 86   Temp 98.3 F (36.8 C) (Oral)   Wt 209 lb 8 oz (95 kg)   SpO2 98%   BMI 32.80 kg/m    Subjective:    Patient ID: Jennifer Whitney, female    DOB: [birth year], 62 y.o.   MRN: [id number]  HPI: Jennifer Whitney is a 62 y.o. female presenting on 12/23/2022 for comprehensive medical examination. Current medical complaints include:none  She currently lives with: Menopausal Symptoms: no  HYPERLIPIDEMIA Hyperlipidemia status: excellent compliance Satisfied with current treatment?  yes Side effects:  no Medication compliance: excellent compliance Past cholesterol meds: none Supplements: none Aspirin:  no The 10-year ASCVD risk score (Arnett DK, et al., 2019) is: 5.8%   Values used to calculate the score:     Age: 58 years     Sex: Female     Is Non-Hispanic African American: No     Diabetic: No     Tobacco smoker: No     Systolic Blood Pressure: 152 mmHg     Is BP treated: No     HDL Cholesterol: 41 mg/dL     Total Cholesterol: 209 mg/dL Chest pain:  no Coronary artery disease:  no Family history CAD:  no Family history early CAD:  no  ADHD FOLLOW UP ADHD status: Patient states she is having trouble sleeping. She is wanting to go to a regular release due to having trouble sleeping.   Satisfied with current therapy: no Medication compliance:  excellent compliance Controlled substance contract: yes Previous psychiatry evaluation: yes Previous medications: yes adderall XR   Taking meds on weekends/vacations: occasionally Work/school performance:  good Difficulty sustaining attention/completing tasks: yes Distracted by extraneous stimuli: yes Does not listen when spoken to: yes  Fidgets with hands or feet: yes Unable to stay in seat: no Blurts out/interrupts others: yes ADHD Medication Side Effects: no    Decreased appetite: no    Headache: no    Sleeping disturbance pattern: no    Irritability: no    Rebound effects (worse than baseline) off  medication: no    Anxiousness: no    Dizziness: no    Tics: no  HYPOTHYROIDISM Thyroid control status:controlled Satisfied with current treatment? yes Medication side effects: no Medication compliance: excellent compliance Etiology of hypothyroidism:  Recent dose adjustment:no Fatigue: yes Cold intolerance: no Heat intolerance: yes Weight gain: yes Weight loss: no Constipation: no Diarrhea/loose stools: no Palpitations: no Lower extremity edema: no Anxiety/depressed mood: no   MOOD Feels like the Celexa is working well for her.  Does have some days where she is down but that is very rare.  Denies SI.   Depression Screen done today and results listed below:     12/23/2022   10:52 AM 09/24/2022    9:53 AM 06/17/2022    2:48 PM 03/12/2022    2:24 PM 12/11/2021    2:55 PM  Depression screen PHQ 2/9  Decreased Interest 0 0 0 3 0  Down, Depressed, Hopeless 1 0 0 1 0  PHQ - 2 Score 1 0 0 4 0  Altered sleeping 3 0 0 3 0  Tired, decreased energy 1 1 0 3 1  Change in appetite 3 2 3 3 1   Feeling bad or failure about yourself  1 0 0 0 0  Trouble concentrating 1 0 2 1 0  Moving slowly or fidgety/restless  0 0 0 0  Suicidal thoughts 1 0 0 0 0  PHQ-9  Score 11 3 5 14 2   Difficult doing work/chores  Not difficult at all Not difficult at all Not difficult at all Not difficult at all    The patient does not have a history of falls. I did complete a risk assessment for falls. A plan of care for falls was documented.   Past Medical History:  Past Medical History:  Diagnosis Date   Acquired hypothyroidism 11/25/2015   ADHD    ADHD (attention deficit hyperactivity disorder), combined type 07/20/2018   Evaluation by Dr. 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    Surgical History:  Past Surgical History:  Procedure Laterality Date   Bladder Mesh  2014   COLONOSCOPY WITH PROPOFOL N/A 04/22/2018   Procedure: COLONOSCOPY WITH PROPOFOL;  Surgeon: Midge Minium, MD;  Location: [facility];  Service: Endoscopy;  Laterality: N/A;   POLYPECTOMY N/A 04/22/2018   Procedure: POLYPECTOMY;  Surgeon: Midge Minium, MD;  Location: [facility];  Service: Endoscopy;  Laterality: N/A;   TOTAL ABDOMINAL HYSTERECTOMY     TOTAL KNEE ARTHROPLASTY Right 01/29/2022   Procedure: TOTAL KNEE ARTHROPLASTY;  Surgeon: Juanell Fairly, MD;  Location: [facility];  Service: Orthopedics;  Laterality: Right;   TUBAL LIGATION      Medications:  Current Outpatient Medications on File Prior to Visit  Medication Sig   acetaminophen (TYLENOL) 325 MG tablet Take by mouth.   citalopram (CELEXA) 20 MG tablet Take 1 tablet by mouth once daily   levothyroxine (SYNTHROID) 100 MCG tablet Take 1 tablet by mouth once daily   loratadine (CLARITIN) 10 MG tablet Take 10 mg by mouth daily.   fluticasone (FLONASE) 50 MCG/ACT nasal spray Place 2 sprays into both nostrils daily.   No current facility-administered medications on file prior to visit.    Allergies:  No Known Allergies  Social History:  Social History   Socioeconomic History   Marital status: Married    Spouse name: Windy Fast   Number of children: 6   Years of education: Not on file   Highest education level: Associate degree: academic program  Occupational History   Occupation: PE Instructor  Tobacco Use   Smoking status: Former    Packs/day: 1.00    Years: 8.00    Additional pack years: 0.00    Total pack years: 8.00    Types: Cigarettes    Start date: 08/10/1977    Quit date:  08/1985    Years since quitting: 37.3   Smokeless tobacco: Never  Vaping Use   Vaping Use: Never used  Substance and Sexual Activity   Alcohol use: Yes    Alcohol/week: 1.0 standard drink of alcohol    Types: 1 Glasses of wine per week   Drug use: No   Sexual activity: Not Currently    Partners: Male  Other Topics Concern   Not on file  Social History Narrative   Lives at home with husband.   Social Determinants of Health   Financial Resource Strain: Low Risk  (11/18/2018)  Overall Financial Resource Strain (CARDIA)    Difficulty of Paying Living Expenses: Not hard at all  Food Insecurity: No Food Insecurity (11/18/2018)   Hunger Vital Sign    Worried About Running Out of Food in the Last Year: Never true    Ran Out of Food in the Last Year: Never true  Transportation Needs: No Transportation Needs (11/18/2018)   PRAPARE - Administrator, Civil Service (Medical): No    Lack of Transportation (Non-Medical): No  Physical Activity: Sufficiently Active (11/18/2018)   Exercise Vital Sign    Days of Exercise per Week: 5 days    Minutes of Exercise per Session: 30 min  Stress: No Stress Concern Present (11/18/2018)   [employer] of Occupational Health - Occupational Stress Questionnaire    Feeling of Stress : Not at all  Social Connections: Socially Integrated (11/18/2018)   Social Connection and Isolation Panel [NHANES]    Frequency of Communication with Friends and Family: Three times a week    Frequency of Social Gatherings with Friends and Family: Three times a week    Attends Religious Services: More than 4 times per year    Active Member of Clubs or Organizations: Yes    Attends Banker Meetings: More than 4 times per year    Marital Status: Married  Catering manager Violence: Not At Risk (11/18/2018)   Humiliation, Afraid, Rape, and Kick questionnaire    Fear of Current or Ex-Partner: No    Emotionally Abused: No    Physically Abused: No     Sexually Abused: No   Social History   Tobacco Use  Smoking Status Former   Packs/day: 1.00   Years: 8.00   Additional pack years: 0.00   Total pack years: 8.00   Types: Cigarettes   Start date: 08/10/1977   Quit date: 08/1985   Years since quitting: 37.3  Smokeless Tobacco Never   Social History   Substance and Sexual Activity  Alcohol Use Yes   Alcohol/week: 1.0 standard drink of alcohol   Types: 1 Glasses of wine per week    Family History:  Family History  Problem Relation Age of Onset   Heart failure Mother    Lung cancer Mother    Heart disease Mother    Stroke Father    Heart attack Father    Hypertension Sister    Hypertension Brother    Arrhythmia Daughter        SVT   Heart disease Paternal Grandmother    Emphysema Paternal Grandfather    Hypertension Sister    Prostate cancer Neg Hx    Kidney cancer Neg Hx    Breast cancer Neg Hx     Past medical history, surgical history, medications, allergies, family history and social history reviewed with patient today and changes made to appropriate areas of the chart.   Review of Systems  Constitutional:  Negative for malaise/fatigue and weight loss.       Weight gain  Cardiovascular:  Negative for palpitations and leg swelling.  Gastrointestinal:  Negative for constipation and diarrhea.  Endo/Heme/Allergies:        Heat intolerance  Psychiatric/Behavioral:  Positive for depression. Negative for suicidal ideas. The patient has insomnia. The patient is not nervous/anxious.    All other ROS negative except what is listed above and in the HPI.      Objective:    BP (!) 152/99   Pulse 86   Temp 98.3 F (36.8 C) (  Oral)   Wt 209 lb 8 oz (95 kg)   SpO2 98%   BMI 32.80 kg/m   Wt Readings from Last 3 Encounters:  12/23/22 209 lb 8 oz (95 kg)  09/24/22 206 lb 12.8 oz (93.8 kg)  06/17/22 208 lb 8 oz (94.6 kg)    Physical Exam Vitals and nursing note reviewed.  Constitutional:      General: She is awake.  She is not in acute distress.    Appearance: Normal appearance. She is well-developed. She is not ill-appearing.  HENT:     Head: Normocephalic and atraumatic.     Right Ear: Hearing, tympanic membrane, ear canal and external ear normal. No drainage.     Left Ear: Hearing, tympanic membrane, ear canal and external ear normal. No drainage.     Nose: Nose normal.     Right Sinus: No maxillary sinus tenderness or frontal sinus tenderness.     Left Sinus: No maxillary sinus tenderness or frontal sinus tenderness.     Mouth/Throat:     Mouth: Mucous membranes are moist.     Pharynx: Oropharynx is clear. Uvula midline. No pharyngeal swelling, oropharyngeal exudate or posterior oropharyngeal erythema.  Eyes:     General: Lids are normal.        Right eye: No discharge.        Left eye: No discharge.     Extraocular Movements: Extraocular movements intact.     Conjunctiva/sclera: Conjunctivae normal.     Pupils: Pupils are equal, round, and reactive to light.     Visual Fields: Right eye visual fields normal and left eye visual fields normal.  Neck:     Thyroid: No thyromegaly.     Vascular: No carotid bruit.     Trachea: Trachea normal.  Cardiovascular:     Rate and Rhythm: Normal rate and regular rhythm.     Heart sounds: Normal heart sounds. No murmur heard.    No gallop.  Pulmonary:     Effort: Pulmonary effort is normal. No accessory muscle usage or respiratory distress.     Breath sounds: Normal breath sounds.  Chest:  Breasts:    Right: Normal.     Left: Normal.  Abdominal:     General: Bowel sounds are normal.     Palpations: Abdomen is soft. There is no hepatomegaly or splenomegaly.     Tenderness: There is no abdominal tenderness.  Musculoskeletal:        General: Normal range of motion.     Cervical back: Normal range of motion and neck supple.     Right lower leg: No edema.     Left lower leg: No edema.  Lymphadenopathy:     Head:     Right side of head: No  submental, submandibular, tonsillar, preauricular or posterior auricular adenopathy.     Left side of head: No submental, submandibular, tonsillar, preauricular or posterior auricular adenopathy.     Cervical: No cervical adenopathy.     Upper Body:     Right upper body: No supraclavicular, axillary or pectoral adenopathy.     Left upper body: No supraclavicular, axillary or pectoral adenopathy.  Skin:    General: Skin is warm and dry.     Capillary Refill: Capillary refill takes less than 2 seconds.     Findings: No rash.  Neurological:     Mental Status: She is alert and oriented to person, place, and time.     Gait: Gait is intact.  Psychiatric:  Attention and Perception: Attention normal.        Mood and Affect: Mood normal.        Speech: Speech normal.        Behavior: Behavior normal. Behavior is cooperative.        Thought Content: Thought content normal.        Judgment: Judgment normal.     Results for orders placed or performed in visit on 12/23/22  Microscopic Examination   Urine  Result Value Ref Range   WBC, UA 0-5 0 - 5 /hpf   RBC, Urine 0-2 0 - 2 /hpf   Epithelial Cells (non renal) 0-10 0 - 10 /hpf   Mucus, UA Present (A) Not Estab.   Bacteria, UA None seen None seen/Few  Urinalysis, Routine w reflex microscopic  Result Value Ref Range   Specific Gravity, UA 1.025 1.005 - 1.030   pH, UA 6.5 5.0 - 7.5   Color, UA Yellow Yellow   Appearance Ur Cloudy (A) Clear   Leukocytes,UA 1+ (A) Negative   Protein,UA Negative Negative/Trace   Glucose, UA Negative Negative   Ketones, UA Negative Negative   RBC, UA Negative Negative   Bilirubin, UA Negative Negative   Urobilinogen, Ur 0.2 0.2 - 1.0 mg/dL   Nitrite, UA Negative Negative   Microscopic Examination See below:       Assessment & Plan:   Problem List Items Addressed This Visit       Endocrine   Acquired hypothyroidism (Chronic)    Chronic.  Labs ordered at visit today.  Currently taking  Levothyroxine daily.  Will make recommendations based on lab results.       Relevant Orders   T4, free     Other   Depression, major, single episode, complete remission (HCC)    Chronic.  Controlled.  Continue with current medication regimen of Celexa 20mg  daily.  Labs ordered today.  Return to clinic in 6 months for reevaluation.  Call sooner if concerns arise.        Obesity (BMI 30.0-34.9)    Recommended eating smaller high protein, low fat meals more frequently and exercising 30 mins a day 5 times a week with a goal of 10-15lb weight loss in the next 3 months.       Attention deficit disorder    Chronic.  Ongoing concern.  Has been on Adderall 30mg  XR.  Now having issues with sleep.  Will change to Adderall regular release.  If eating concern has not improved at next visit, can consider changing to vyvanse to help with appetite control.  Follow up in 1 month.  Call sooner if concerns arise.       Mixed hyperlipidemia    Labs ordered at visit today.  Will make recommendations based on lab results.        Relevant Orders   Lipid panel   Annual physical exam - Primary    Health maintenance reviewed during visit today.  Labs ordered.  Shingles shot given.  Colonoscopy up to date.  Mammogram ordered.       Relevant Orders   CBC with Differential/Platelet   Comprehensive metabolic panel   Lipid panel   TSH   Urinalysis, Routine w reflex microscopic (Completed)   Other Visit Diagnoses     Elevated blood pressure reading       Elevated at visit today.  Does not check at home.  Follow up in 1 month for reevaluation.  If still elevated, will  discuss medication at that time.   Encounter for screening mammogram for malignant neoplasm of breast       Relevant Orders   MM 3D SCREENING MAMMOGRAM BILATERAL BREAST   Need for shingles vaccine       Relevant Orders   Zoster Recombinant (Shingrix ) (Completed)   Abnormal urinalysis       Relevant Orders   Urine Culture         Follow up plan: Return in about 1 month (around 01/23/2023) for Medication Management, BP Check.   LABORATORY TESTING:  - Pap smear: not applicable  IMMUNIZATIONS:   - Tdap: Tetanus vaccination status reviewed: last tetanus booster within 10 years. - Influenza: Postponed to flu season - Pneumovax: Not applicable - Prevnar: Not applicable - COVID: Not applicable - HPV: Not applicable - Shingrix vaccine:  Administered today  SCREENING: -Mammogram: Ordered today  - Colonoscopy: Up to date  - Bone Density: Not applicable  -Hearing Test: Not applicable  -Spirometry: Not applicable   PATIENT COUNSELING:   Advised to take 1 mg of folate supplement per day if capable of pregnancy.   Sexuality: Discussed sexually transmitted diseases, partner selection, use of condoms, avoidance of unintended pregnancy  and contraceptive alternatives.   Advised to avoid cigarette smoking.  I discussed with the patient that most people either abstain from alcohol or drink within safe limits (<=14/week and <=4 drinks/occasion for males, <=7/weeks and <= 3 drinks/occasion for females) and that the risk for alcohol disorders and other health effects rises proportionally with the number of drinks per week and how often a drinker exceeds daily limits.  Discussed cessation/primary prevention of drug use and availability of treatment for abuse.   Diet: Encouraged to adjust caloric intake to maintain  or achieve ideal body weight, to reduce intake of dietary saturated fat and total fat, to limit sodium intake by avoiding high sodium foods and not adding table salt, and to maintain adequate dietary potassium and calcium preferably from fresh fruits, vegetables, and low-fat dairy products.    stressed the importance of regular exercise  Injury prevention: Discussed safety belts, safety helmets, smoke detector, smoking near bedding or upholstery.   Dental health: Discussed importance of regular tooth  brushing, flossing, and dental visits.    NEXT PREVENTATIVE PHYSICAL DUE IN 1 YEAR. Return in about 1 month (around 01/23/2023) for Medication Management, BP Check.

## 2022-12-23 NOTE — Assessment & Plan Note (Signed)
Recommended eating smaller high protein, low fat meals more frequently and exercising 30 mins a day 5 times a week with a goal of 10-15lb weight loss in the next 3 months.  

## 2022-12-23 NOTE — Assessment & Plan Note (Signed)
Health maintenance reviewed during visit today.  Labs ordered.  Shingles shot given.  Colonoscopy up to date.  Mammogram ordered.

## 2022-12-23 NOTE — Assessment & Plan Note (Signed)
Chronic.  Labs ordered at visit today.  Currently taking Levothyroxine daily.  Will make recommendations based on lab results.

## 2022-12-24 LAB — COMPREHENSIVE METABOLIC PANEL
ALT: 23 IU/L (ref 0–32)
AST: 22 IU/L (ref 0–40)
Albumin/Globulin Ratio: 2.1 (ref 1.2–2.2)
Albumin: 4.4 g/dL (ref 3.8–4.9)
Alkaline Phosphatase: 129 IU/L — ABNORMAL HIGH (ref 44–121)
BUN/Creatinine Ratio: 17 (ref 12–28)
BUN: 14 mg/dL (ref 8–27)
Bilirubin Total: <0.2 mg/dL (ref 0.0–1.2)
CO2: 25 mmol/L (ref 20–29)
Calcium: 9.3 mg/dL (ref 8.7–10.3)
Chloride: 100 mmol/L (ref 96–106)
Creatinine, Ser: 0.84 mg/dL (ref 0.57–1.00)
Globulin, Total: 2.1 g/dL (ref 1.5–4.5)
Glucose: 89 mg/dL (ref 70–99)
Potassium: 4.5 mmol/L (ref 3.5–5.2)
Sodium: 138 mmol/L (ref 134–144)
Total Protein: 6.5 g/dL (ref 6.0–8.5)
eGFR: 80 mL/min/{1.73_m2} (ref >59)

## 2022-12-24 LAB — CBC WITH DIFFERENTIAL/PLATELET
Basophils Absolute: 0.1 10*3/uL (ref 0.0–0.2)
Basos: 1 %
EOS (ABSOLUTE): 0.2 10*3/uL (ref 0.0–0.4)
Eos: 3 %
Hematocrit: 42.4 % (ref 34.0–46.6)
Hemoglobin: 14.8 g/dL (ref 11.1–15.9)
Immature Grans (Abs): 0 10*3/uL (ref 0.0–0.1)
Immature Granulocytes: 0 %
Lymphocytes Absolute: 1.5 10*3/uL (ref 0.7–3.1)
Lymphs: 29 %
MCH: 33.3 pg — ABNORMAL HIGH (ref 26.6–33.0)
MCHC: 34.9 g/dL (ref 31.5–35.7)
MCV: 96 fL (ref 79–97)
Monocytes Absolute: 0.5 10*3/uL (ref 0.1–0.9)
Monocytes: 9 %
Neutrophils Absolute: 3 10*3/uL (ref 1.4–7.0)
Neutrophils: 58 %
Platelets: 262 10*3/uL (ref 150–450)
RBC: 4.44 x10E6/uL (ref 3.77–5.28)
RDW: 13.4 % (ref 11.7–15.4)
WBC: 5.2 10*3/uL (ref 3.4–10.8)

## 2022-12-24 LAB — TSH: TSH: 3.21 u[IU]/mL (ref 0.450–4.500)

## 2022-12-24 LAB — T4, FREE: Free T4: 1.28 ng/dL (ref 0.82–1.77)

## 2022-12-24 LAB — LIPID PANEL
Chol/HDL Ratio: 5.8 ratio — ABNORMAL HIGH (ref 0.0–4.4)
Cholesterol, Total: 197 mg/dL (ref 100–199)
HDL: 34 mg/dL — ABNORMAL LOW (ref >39)
LDL Chol Calc (NIH): 133 mg/dL — ABNORMAL HIGH (ref 0–99)
Triglycerides: 165 mg/dL — ABNORMAL HIGH (ref 0–149)
VLDL Cholesterol Cal: 30 mg/dL (ref 5–40)

## 2022-12-24 MED ORDER — ROSUVASTATIN CALCIUM 5 MG PO TABS: 5.0000 mg | ORAL_TABLET | Freq: Every day | ORAL | 1 refills | Status: AC

## 2022-12-24 NOTE — Progress Notes (Signed)
Medication sent to the pharmacy.

## 2022-12-24 NOTE — Progress Notes (Signed)
Please let patient know that her cholesterol is elevated.  Her cardiac risk score puts her at moderate risk of having a stroke or heart attack over the next 10 years.  I recommend that she start a statin called crestor 5mg  daily.  The goal will be to increase this to 20mg  daily if patient tolerates it well.  If she agrees to the medication I can send it to the pharmacy.    Otherwise, her lab work looks good.  No other concerns at this time.

## 2022-12-24 NOTE — Addendum Note (Signed)
Addended by: Larae Grooms on: 12/24/2022 03:08 PM   Modules accepted: Orders

## 2022-12-26 LAB — URINE CULTURE

## 2022-12-28 NOTE — Progress Notes (Signed)
Hi Jennifer Whitney.  Your urine culture grew a bacteria that we often find in urine.  This means you do not need to be treated for a UTI.

## 2023-01-11 ENCOUNTER — Ambulatory Visit
Admission: RE | Admit: 2023-01-11 | Discharge: 2023-01-11 | Disposition: A | Discharge status: Home / Self Care | Payer: 59 | Source: Ambulatory Visit | Attending: Nurse Practitioner | Admitting: Nurse Practitioner

## 2023-01-11 DIAGNOSIS — Z1231 Encounter for screening mammogram for malignant neoplasm of breast: Secondary | ICD-10-CM | POA: Insufficient documentation

## 2023-01-12 NOTE — Progress Notes (Signed)
Please let patient know her Mammogram did not show any evidence of a malignancy.  The recommendation is to repeat the Mammogram in 1 year.  

## 2023-01-14 ENCOUNTER — Ambulatory Visit: Payer: 59 | Admitting: Physician Assistant

## 2023-01-15 ENCOUNTER — Ambulatory Visit: Payer: 59 | Admitting: Physician Assistant

## 2023-01-15 ENCOUNTER — Encounter: Payer: Self-pay | Admitting: Physician Assistant

## 2023-01-15 VITALS — BP 130/77 | HR 66 | Temp 98.4°F | Wt 207.4 lb

## 2023-01-15 DIAGNOSIS — M546 Pain in thoracic spine: Secondary | ICD-10-CM | POA: Diagnosis not present

## 2023-01-15 DIAGNOSIS — M545 Low back pain, unspecified: Secondary | ICD-10-CM | POA: Diagnosis not present

## 2023-01-15 MED ORDER — MELOXICAM 15 MG PO TABS
15.0000 mg | ORAL_TABLET | Freq: Every day | ORAL | 0 refills | Status: DC
Start: 2023-01-15 — End: 2023-02-09

## 2023-01-15 MED ORDER — PREDNISONE 20 MG PO TABS
ORAL_TABLET | ORAL | 0 refills | Status: DC
Start: 2023-01-15 — End: 2023-05-04

## 2023-01-15 MED ORDER — METHOCARBAMOL 750 MG PO TABS
750.0000 mg | ORAL_TABLET | Freq: Three times a day (TID) | ORAL | 0 refills | Status: DC | PRN
Start: 2023-01-15 — End: 2023-05-29

## 2023-01-15 NOTE — Patient Instructions (Addendum)
Based on your symptoms and physical exam I believe the following is the cause of your concern today Back pain likely secondary to a strain of your back muscles  I recommend the following at this time to help relieve that discomfort:  Rest Warm compresses to the area (20 minutes on, minimum of 30 minutes off) You can alternate Tylenol and Ibuprofen for pain management but Ibuprofen is typically preferred to reduce inflammation.  Gentle stretches and exercises that I have included in your paperwork Try to reduce excess strain to the area and rest as much as possible  Wear supportive shoes and, if you must lift anything, use proper lifting techniques that spare your back.   I have sent in a script for Prednisone to help with the inflammation. I recommend starting this tomorrow morning as taking it later in the day can cause sleeplessness. It can also cause irritability and increased hunger. I have sent in a script for Meloxicam 15 mg - take this once per day. If you take this, please do not take other NSAIDs (ibuprofen, Aleve, Advil) and stay well hydrated I have sent in a script for Robaxin which is a muscle relaxer-these can make you drowsy and sleepy so do not use when you need to remain alert  If these measures do not lead to improvement in your symptoms over the next 2-4 weeks please let us know

## 2023-01-15 NOTE — Progress Notes (Unsigned)
Acute Office Visit   Patient: Jennifer Whitney   DOB: 1960-09-08   62 y.o. Female  MRN: [id number] Visit Date: 01/15/2023  Today's healthcare provider: Oswaldo Conroy Lucah Petta, PA-C  Introduced myself to the patient as a Secondary school teacher and provided education on APPs in clinical practice.    Chief Complaint  Patient presents with   Back Pain    Pt states she started having back pain on Tuesday. States she recently started a new job where she is doing more lifting. States she has been having constant pain and spasms.    Subjective    HPI HPI     Back Pain    Additional comments: Pt states she started having back pain on Tuesday. States she recently started a new job where she is doing more lifting. States she has been having constant pain and spasms.       Last edited by Jennifer Whitney, CMA on 01/15/2023  3:35 PM.        Back Pain   Reports she started a new job about 3 weeks ago and she has had to do a lot of lifting since starting  She denies specific incident of pulling or strain - thinks this is cumulative from repeated days of work  Onset: gradual  Duration: since Monday  Location: shoulders and into neck, lower back pain  Radiation: from shoulders into neck  Pain level and character: 6-7/10 stabbing and sharp in nature  Other associated symptoms: she denies numbness, tingling, weakness, gait trouble, incontinence, saddle anesthesia  Interventions: rest, Ibuprofen, tylenol, mild stretches, ice Alleviating: ice seems to help a bit  Aggravating: bending forward, lifting arms overhead       Medications: Outpatient Medications Prior to Visit  Medication Sig   acetaminophen (TYLENOL) 325 MG tablet Take by mouth.   amphetamine-dextroamphetamine (ADDERALL) 30 MG tablet Take 1 tablet by mouth daily.   citalopram (CELEXA) 20 MG tablet Take 1 tablet by mouth once daily   levothyroxine (SYNTHROID) 100 MCG tablet Take 1 tablet by mouth once daily   loratadine (CLARITIN) 10 MG  tablet Take 10 mg by mouth daily.   rosuvastatin (CRESTOR) 5 MG tablet Take 1 tablet (5 mg total) by mouth daily.   fluticasone (FLONASE) 50 MCG/ACT nasal spray Place 2 sprays into both nostrils daily.   No facility-administered medications prior to visit.    Review of Systems  Musculoskeletal:  Positive for back pain, myalgias and neck pain.    {Labs  Heme  Chem  Endocrine  Serology  Results Review (optional):23779}   Objective    BP 130/77   Pulse 66   Temp 98.4 F (36.9 C) (Oral)   Wt 207 lb 6.4 oz (94.1 kg)   SpO2 98%   BMI 32.48 kg/m  {Show previous vital signs (optional):23777}  Physical Exam Vitals reviewed.  Constitutional:      General: She is awake.     Appearance: Normal appearance. She is well-developed and well-groomed.  HENT:     Head: Normocephalic and atraumatic.  Pulmonary:     Effort: Pulmonary effort is normal.  Musculoskeletal:     Cervical back: No swelling, edema, deformity or erythema. Decreased range of motion.     Thoracic back: Spasms present. No swelling, deformity, tenderness or bony tenderness. Normal range of motion.     Lumbar back: Spasms and tenderness present. No swelling, edema, signs of trauma or bony tenderness. Normal range of motion. Negative  right straight leg raise test and negative left straight leg raise test.       Back:     Comments: She denies midline tenderness. No obvious slipoffs or deformities   Cervical: decreased ROM with lateral rotation to the left  Shoulder ROM intact  Thoracic ROM intact- mild pain with lateral rotation and flexion to the left Lumbar: flexion and extension intact Hips: flexion and extension intact, abduction and adduction intact Pain with extension on right side   Neurological:     Mental Status: She is alert.  Psychiatric:        Behavior: Behavior is cooperative.       No results found for any visits on 01/15/23.  Assessment & Plan      No follow-ups on file.       Problem  List Items Addressed This Visit   None Visit Diagnoses     Acute bilateral thoracic back pain    -  Primary   Acute bilateral low back pain without sciatica            No follow-ups on file.   I, Jennifer Peaden E Hang Ammon, PA-C, have reviewed all documentation for this visit. The documentation on 01/15/23 for the exam, diagnosis, procedures, and orders are all accurate and complete.   Jennifer Whitney, MHS, PA-C [facility]

## 2023-02-08 ENCOUNTER — Other Ambulatory Visit: Payer: Self-pay | Admitting: Physician Assistant

## 2023-02-08 ENCOUNTER — Other Ambulatory Visit: Payer: Self-pay | Admitting: Nurse Practitioner

## 2023-02-08 DIAGNOSIS — M545 Low back pain, unspecified: Secondary | ICD-10-CM

## 2023-02-08 DIAGNOSIS — M546 Pain in thoracic spine: Secondary | ICD-10-CM

## 2023-02-09 NOTE — Telephone Encounter (Signed)
Requested Prescriptions  Pending Prescriptions Disp Refills   meloxicam (MOBIC) 15 MG tablet [Pharmacy Med Name: Meloxicam 15 MG Oral Tablet] 30 tablet 0    Sig: Take 1 tablet by mouth once daily     Analgesics:  COX2 Inhibitors Failed - 02/08/2023  1:51 PM      Failed - Manual Review: Labs are only required if the patient has taken medication for more than 8 weeks.      Passed - HGB in normal range and within 360 days    Hemoglobin  Date Value Ref Range Status  12/23/2022 14.8 11.1 - 15.9 g/dL Final         Passed - Cr in normal range and within 360 days    Creatinine  Date Value Ref Range Status  06/17/2022 121.4 20.0 - 300.0 mg/dL Final   Creat  Date Value Ref Range Status  09/14/2018 0.96 0.50 - 1.05 mg/dL Final    Comment:    For patients >13 years of age, the reference limit for Creatinine is approximately 13% higher for people identified as African-American. .    Creatinine, Ser  Date Value Ref Range Status  12/23/2022 0.84 0.57 - 1.00 mg/dL Final         Passed - HCT in normal range and within 360 days    Hematocrit  Date Value Ref Range Status  12/23/2022 42.4 34.0 - 46.6 % Final         Passed - AST in normal range and within 360 days    AST  Date Value Ref Range Status  12/23/2022 22 0 - 40 IU/L Final         Passed - ALT in normal range and within 360 days    ALT  Date Value Ref Range Status  12/23/2022 23 0 - 32 IU/L Final         Passed - eGFR is 30 or above and within 360 days    GFR, Est African American  Date Value Ref Range Status  09/14/2018 77 > OR = 60 mL/min/1.20m2 Final   GFR, Est Non African American  Date Value Ref Range Status  09/14/2018 66 > OR = 60 mL/min/1.1m2 Final   GFR, Estimated  Date Value Ref Range Status  01/30/2022 >60 >60 mL/min Final    Comment:    (NOTE) Calculated using the CKD-EPI Creatinine Equation (2021)    eGFR  Date Value Ref Range Status  12/23/2022 80 >59 mL/min/1.73 Final         Passed -  Patient is not pregnant      Passed - Valid encounter within last 12 months    Recent Outpatient Visits           3 weeks ago Acute bilateral thoracic back pain   [place] [facility] Mecum, Oswaldo Conroy, PA-C   1 month ago Annual physical exam   [place] [facility] Larae Grooms, NP   4 months ago Congestion of nasal sinus   [place] [facility] Larae Grooms, NP   7 months ago Attention deficit hyperactivity disorder (ADHD), unspecified ADHD type   [place] [facility] Larae Grooms, NP   11 months ago Painless rectal bleeding   [place] [facility] Mecum, Oswaldo Conroy, PA-C

## 2023-03-01 ENCOUNTER — Telehealth: Payer: Self-pay | Admitting: Nurse Practitioner

## 2023-03-01 DIAGNOSIS — F909 Attention-deficit hyperactivity disorder, unspecified type: Secondary | ICD-10-CM

## 2023-03-01 MED ORDER — AMPHETAMINE-DEXTROAMPHETAMINE 30 MG PO TABS: 30.0000 mg | ORAL_TABLET | Freq: Every day | ORAL | 0 refills | Status: DC

## 2023-03-01 NOTE — Telephone Encounter (Signed)
Medication Refill - Medication:  amphetamine-dextroamphetamine (ADDERALL) 30 MG tablet   Pt states that she was just in the office on 01/15/2023 and seen Erin Mecum and was told that all of her medications has been updated. Pt states that she cannot afford to keep coming to the office for medication refills. Pt states that she has been out of medication for a while and she cannot concentrate.    Has the patient contacted their pharmacy? Yes.    Preferred Pharmacy (with phone number or street name):  [facility] [address] [place] - [address]  Phone: [phone] Fax: [phone]  Has the patient been seen for an appointment in the last year OR does the patient have an upcoming appointment? Yes.    Agent: Please be advised that RX refills may take up to 3 business days. We ask that you follow-up with your pharmacy.

## 2023-03-01 NOTE — Telephone Encounter (Signed)
She was last seen for her ADHD in May and there was  a change to her medications. She was supposed to follow up in one month to discuss these changes and the effects on her symptoms. Her most recent office visit did not address this concern. She will need an apt for further refills. For controlled medications, such as Adderall and most ADHD medications, there is a requirement for regular office visits every 3 months for monitoring.

## 2023-03-04 ENCOUNTER — Encounter: Payer: Self-pay | Admitting: Physician Assistant

## 2023-03-04 ENCOUNTER — Ambulatory Visit: Payer: 59 | Admitting: Physician Assistant

## 2023-03-04 DIAGNOSIS — M546 Pain in thoracic spine: Secondary | ICD-10-CM | POA: Diagnosis not present

## 2023-03-04 DIAGNOSIS — M545 Low back pain, unspecified: Secondary | ICD-10-CM

## 2023-03-04 DIAGNOSIS — F909 Attention-deficit hyperactivity disorder, unspecified type: Secondary | ICD-10-CM | POA: Diagnosis not present

## 2023-03-04 MED ORDER — AMPHETAMINE-DEXTROAMPHET ER 30 MG PO CP24
30.0000 mg | ORAL_CAPSULE | Freq: Every day | ORAL | 0 refills | Status: AC
Start: 2023-03-04 — End: 2023-04-03

## 2023-03-04 MED ORDER — AMPHETAMINE-DEXTROAMPHET ER 30 MG PO CP24
30.0000 mg | ORAL_CAPSULE | Freq: Every day | ORAL | 0 refills | Status: AC
Start: 2023-04-04 — End: 2023-05-04

## 2023-03-04 MED ORDER — AMPHETAMINE-DEXTROAMPHET ER 30 MG PO CP24
30.0000 mg | ORAL_CAPSULE | Freq: Every day | ORAL | 0 refills | Status: AC
Start: 2023-05-05 — End: 2023-06-04

## 2023-03-04 MED ORDER — MELOXICAM 15 MG PO TABS: 15.0000 mg | ORAL_TABLET | Freq: Every day | ORAL | 0 refills | Status: DC

## 2023-03-04 NOTE — Progress Notes (Signed)
Established Patient Office Visit  Name: Jennifer Whitney   MRN: [id number]    DOB: [birth year]   Date:03/04/2023  Today's Provider: Jacquelin Hawking, MHS, PA-C Introduced myself to the patient as a PA-C and provided education on APPs in clinical practice.         Subjective  Chief Complaint  Chief Complaint  Patient presents with   Medication Refill    Patient says she is here for a refill on her Adderrall prescription. Patient is also wanting to discuss having a refill on her prescription for Meloxicam. Patient says a provider with [facility] had prescribed it in the past, but she is wondering if she can have a refill at today's visit.     HPI  ADHD FOLLOW UP ADHD status: controlled States she notices that it doesn't seem to last as long. Is not sure when it drops off as she has not had medications for about a week  She would prefer to go back to the Adderall XR 30 mg PO every day  Satisfied with current therapy: yes Medication compliance:  good compliance Controlled substance contract: yes Previous psychiatry evaluation: yes Current medications: yes adderall  30 mg PO every day  Taking meds on weekends/vacations: occasionally Work/school performance:  average- improved with medication use  Difficulty sustaining attention/completing tasks: yes- improved with medications  Distracted by extraneous stimuli: yes Does not listen when spoken to: no  Fidgets with hands or feet: yes- worse when not taking medications  Unable to stay in seat: no- controlled with medications  Blurts out/interrupts others: no ADHD Medication Side Effects: yes    Decreased appetite: no    Headache: no    Sleeping disturbance pattern: no    Irritability: no    Rebound effects (worse than baseline) off medication: yes    Anxiousness: no    Dizziness: no    Tics: no  She reports some ongoing back pain  She states she has left her previous job and is no longer having to do as much heavy  lifting She would like a refill of Meloxicam today    Patient Active Problem List   Diagnosis Date Noted   Internal hemorrhoid 03/12/2022   S/P TKR (total knee replacement) using cement, right 01/29/2022   Screening for tuberculosis 12/11/2021   Annual physical exam 12/11/2021   Hypothyroidism 09/15/2021   Chronic pain of right knee 01/02/2021   Family history of heart disease 08/02/2018   Mixed hyperlipidemia 08/02/2018   Attention deficit disorder 07/20/2018   Polyp of sigmoid colon    Arthralgia 01/31/2018   Obesity (BMI 30.0-34.9) 01/31/2018   Chronic back pain 06/28/2017   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   Allergic rhinitis 06/14/2017   Depression, major, single episode, complete remission (HCC) 06/14/2017   Menopausal syndrome (hot flashes) 06/14/2017   Urinary incontinence in female 06/14/2017   Acquired hypothyroidism 11/25/2015    Past Surgical History:  Procedure Laterality Date   Bladder Mesh  2014   COLONOSCOPY WITH PROPOFOL N/A 04/22/2018   Procedure: COLONOSCOPY WITH PROPOFOL;  Surgeon: Midge Minium, MD;  [facility];  Service: Endoscopy;  Laterality: N/A;   POLYPECTOMY N/A 04/22/2018   Procedure: POLYPECTOMY;  Surgeon: Midge Minium, MD;  Location: [facility];  Service: Endoscopy;  Laterality: N/A;   TOTAL ABDOMINAL HYSTERECTOMY     TOTAL KNEE ARTHROPLASTY Right 01/29/2022   Procedure: TOTAL KNEE ARTHROPLASTY;  Surgeon: Juanell Fairly, MD;  Location: [facility];  Service: Orthopedics;  Laterality: Right;   TUBAL LIGATION      Family History  Problem Relation Age of Onset   Heart failure Mother    Lung cancer Mother    Heart disease Mother    Stroke Father    Heart attack Father    Hypertension Sister    Hypertension Brother    Arrhythmia Daughter        SVT   Heart disease Paternal Grandmother    Emphysema Paternal Grandfather    Hypertension Sister    Prostate cancer Neg Hx    Kidney cancer Neg Hx    Breast cancer Neg  Hx     Social History   Tobacco Use   Smoking status: Former    Current packs/day: 0.00    Average packs/day: 1 pack/day for 8.0 years (8.0 ttl pk-yrs)    Types: Cigarettes    Start date: 08/10/1977    Quit date: 08/1985    Years since quitting: 37.5   Smokeless tobacco: Never  Substance Use Topics   Alcohol use: Yes    Alcohol/week: 1.0 standard drink of alcohol    Types: 1 Glasses of wine per week     Current Outpatient Medications:    acetaminophen (TYLENOL) 325 MG tablet, Take by mouth., Disp: , Rfl:    amphetamine-dextroamphetamine (ADDERALL XR) 30 MG 24 hr capsule, Take 1 capsule (30 mg total) by mouth daily., Disp: 30 capsule, Rfl: 0   [START ON 04/04/2023] amphetamine-dextroamphetamine (ADDERALL XR) 30 MG 24 hr capsule, Take 1 capsule (30 mg total) by mouth daily., Disp: 30 capsule, Rfl: 0   [START ON 05/05/2023] amphetamine-dextroamphetamine (ADDERALL XR) 30 MG 24 hr capsule, Take 1 capsule (30 mg total) by mouth daily., Disp: 30 capsule, Rfl: 0   citalopram (CELEXA) 20 MG tablet, Take 1 tablet by mouth once daily, Disp: 90 tablet, Rfl: 1   levothyroxine (SYNTHROID) 100 MCG tablet, Take 1 tablet by mouth once daily, Disp: 90 tablet, Rfl: 1   loratadine (CLARITIN) 10 MG tablet, Take 10 mg by mouth daily., Disp: , Rfl:    rosuvastatin (CRESTOR) 5 MG tablet, Take 1 tablet (5 mg total) by mouth daily., Disp: 90 tablet, Rfl: 1   fluticasone (FLONASE) 50 MCG/ACT nasal spray, Place 2 sprays into both nostrils daily., Disp: 11.1 mL, Rfl: 1   meloxicam (MOBIC) 15 MG tablet, Take 1 tablet (15 mg total) by mouth daily., Disp: 30 tablet, Rfl: 0   methocarbamol (ROBAXIN-750) 750 MG tablet, Take 1 tablet (750 mg total) by mouth every 8 (eight) hours as needed for muscle spasms. (Patient not taking: Reported on 03/04/2023), Disp: 30 tablet, Rfl: 0   predniSONE (DELTASONE) 20 MG tablet, Take 60mg  PO daily x 2 days, then40mg  PO daily x 2 days, then 20mg  PO daily x 3 days (Patient not taking:  Reported on 03/04/2023), Disp: 13 tablet, Rfl: 0  No Known Allergies  I personally reviewed active problem list, medication list, allergies, notes from last encounter, lab results with the patient/caregiver today.   Review of Systems  Musculoskeletal:  Positive for back pain.  Psychiatric/Behavioral:         ADHD        Objective  Vitals:   03/04/23 1452  BP: 131/86  Pulse: 90  SpO2: 93%  Weight: 207 lb 12.8 oz (94.3 kg)  Height: 5\' 7"  (1.702 m)    Body mass index is 32.55 kg/m.  Physical Exam Vitals reviewed.  Constitutional:  General: She is awake.     Appearance: Normal appearance. She is well-developed and well-groomed.  HENT:     Head: Normocephalic and atraumatic.  Pulmonary:     Effort: Pulmonary effort is normal.  Musculoskeletal:     Cervical back: Normal range of motion.  Neurological:     General: No focal deficit present.     Mental Status: She is alert and oriented to person, place, and time.     GCS: GCS eye subscore is 4. GCS verbal subscore is 5. GCS motor subscore is 6.     Cranial Nerves: No cranial nerve deficit, dysarthria or facial asymmetry.     Gait: Gait is intact.  Psychiatric:        Attention and Perception: Attention and perception normal.        Mood and Affect: Mood and affect normal.        Speech: Speech normal.        Behavior: Behavior normal. Behavior is cooperative.      Recent Results (from the past 2160 hour(s))  Urinalysis, Routine w reflex microscopic     Status: Abnormal   Collection Time: 12/23/22 11:06 AM  Result Value Ref Range   Specific Gravity, UA 1.025 1.005 - 1.030   pH, UA 6.5 5.0 - 7.5   Color, UA Yellow Yellow   Appearance Ur Cloudy (A) Clear   Leukocytes,UA 1+ (A) Negative   Protein,UA Negative Negative/Trace   Glucose, UA Negative Negative   Ketones, UA Negative Negative   RBC, UA Negative Negative   Bilirubin, UA Negative Negative   Urobilinogen, Ur 0.2 0.2 - 1.0 mg/dL   Nitrite, UA  Negative Negative   Microscopic Examination See below:   Microscopic Examination     Status: Abnormal   Collection Time: 12/23/22 11:06 AM   Urine  Result Value Ref Range   WBC, UA 0-5 0 - 5 /hpf   RBC, Urine 0-2 0 - 2 /hpf   Epithelial Cells (non renal) 0-10 0 - 10 /hpf   Mucus, UA Present (A) Not Estab.   Bacteria, UA None seen None seen/Few  CBC with Differential/Platelet     Status: Abnormal   Collection Time: 12/23/22 11:08 AM  Result Value Ref Range   WBC 5.2 3.4 - 10.8 x10E3/uL   RBC 4.44 3.77 - 5.28 x10E6/uL   Hemoglobin 14.8 11.1 - 15.9 g/dL   Hematocrit 82.9 56.2 - 46.6 %   MCV 96 79 - 97 fL   MCH 33.3 (H) 26.6 - 33.0 pg   MCHC 34.9 31.5 - 35.7 g/dL   RDW 13.0 86.5 - 78.4 %   Platelets 262 150 - 450 x10E3/uL   Neutrophils 58 Not Estab. %   Lymphs 29 Not Estab. %   Monocytes 9 Not Estab. %   Eos 3 Not Estab. %   Basos 1 Not Estab. %   Neutrophils Absolute 3.0 1.4 - 7.0 x10E3/uL   Lymphocytes Absolute 1.5 0.7 - 3.1 x10E3/uL   Monocytes Absolute 0.5 0.1 - 0.9 x10E3/uL   EOS (ABSOLUTE) 0.2 0.0 - 0.4 x10E3/uL   Basophils Absolute 0.1 0.0 - 0.2 x10E3/uL   Immature Granulocytes 0 Not Estab. %   Immature Grans (Abs) 0.0 0.0 - 0.1 x10E3/uL  Comprehensive metabolic panel     Status: Abnormal   Collection Time: 12/23/22 11:08 AM  Result Value Ref Range   Glucose 89 70 - 99 mg/dL   BUN 14 8 - 27 mg/dL   Creatinine, Ser 6.96  0.57 - 1.00 mg/dL   eGFR 80 >27 OZ/DGU/4.40   BUN/Creatinine Ratio 17 12 - 28   Sodium 138 134 - 144 mmol/L   Potassium 4.5 3.5 - 5.2 mmol/L   Chloride 100 96 - 106 mmol/L   CO2 25 20 - 29 mmol/L   Calcium 9.3 8.7 - 10.3 mg/dL   Total Protein 6.5 6.0 - 8.5 g/dL   Albumin 4.4 3.8 - 4.9 g/dL   Globulin, Total 2.1 1.5 - 4.5 g/dL   Albumin/Globulin Ratio 2.1 1.2 - 2.2   Bilirubin Total <0.2 0.0 - 1.2 mg/dL   Alkaline Phosphatase 129 (H) 44 - 121 IU/L   AST 22 0 - 40 IU/L   ALT 23 0 - 32 IU/L  Lipid panel     Status: Abnormal   Collection Time:  12/23/22 11:08 AM  Result Value Ref Range   Cholesterol, Total 197 100 - 199 mg/dL   Triglycerides 347 (H) 0 - 149 mg/dL   HDL 34 (L) >42 mg/dL   VLDL Cholesterol Cal 30 5 - 40 mg/dL   LDL Chol Calc (NIH) 595 (H) 0 - 99 mg/dL   Chol/HDL Ratio 5.8 (H) 0.0 - 4.4 ratio    Comment:                                   T. Chol/HDL Ratio                                             Men  Women                               1/2 Avg.Risk  3.4    3.3                                   Avg.Risk  5.0    4.4                                2X Avg.Risk  9.6    7.1                                3X Avg.Risk 23.4   11.0   TSH     Status: None   Collection Time: 12/23/22 11:08 AM  Result Value Ref Range   TSH 3.210 0.450 - 4.500 uIU/mL  T4, free     Status: None   Collection Time: 12/23/22 11:08 AM  Result Value Ref Range   Free T4 1.28 0.82 - 1.77 ng/dL  Urine Culture     Status: Abnormal   Collection Time: 12/23/22 12:01 PM   Specimen: Urine   UR  Result Value Ref Range   Urine Culture, Routine Final report (A)    Organism ID, Bacteria Comment (A)     Comment: Beta hemolytic Streptococcus, group B 10,000-25,000 colony forming units per mL Penicillin and ampicillin are drugs of choice for treatment of beta-hemolytic streptococcal infections. Susceptibility testing of penicillins and other beta-lactam agents approved by the FDA for treatment of beta-hemolytic streptococcal infections need  not be performed routinely because nonsusceptible isolates are extremely rare in any beta-hemolytic streptococcus and have not been reported for Streptococcus pyogenes (group A). (CLSI)    ORGANISM ID, BACTERIA Comment     Comment: Mixed urogenital flora Less than 10,000 colonies/mL      PHQ2/9:    03/04/2023    2:58 PM 12/23/2022   10:52 AM 09/24/2022    9:53 AM 06/17/2022    2:48 PM 03/12/2022    2:24 PM  Depression screen PHQ 2/9  Decreased Interest 0 0 0 0 3  Down, Depressed, Hopeless 0 1 0 0 1  PHQ  - 2 Score 0 1 0 0 4  Altered sleeping 0 3 0 0 3  Tired, decreased energy 0 1 1 0 3  Change in appetite 0 3 2 3 3   Feeling bad or failure about yourself  0 1 0 0 0  Trouble concentrating 0 1 0 2 1  Moving slowly or fidgety/restless 0  0 0 0  Suicidal thoughts 0 1 0 0 0  PHQ-9 Score 0 11 3 5 14   Difficult doing work/chores Not difficult at all  Not difficult at all Not difficult at all Not difficult at all      Fall Risk:    03/04/2023    2:57 PM 12/23/2022   10:51 AM 09/24/2022    9:53 AM 06/17/2022    2:47 PM 03/12/2022    2:23 PM  Fall Risk   Falls in the past year? 0 0 0 0 0  Number falls in past yr: 0 0 0 0 0  Injury with Fall? 0 0 0 0 0  Risk for fall due to : No Fall Risks No Fall Risks No Fall Risks No Fall Risks No Fall Risks  Follow up Falls evaluation completed Falls evaluation completed Falls evaluation completed Falls evaluation completed Falls evaluation completed      Functional Status Survey:      Assessment & Plan  Problem List Items Addressed This Visit       Other   Attention deficit disorder    Chronic, historic condition Reports she has not had medication for about a week  She states the change to the short release was okay but she noticed that she started having issues with attention earlier in the day (focus did not last as long as she needed it to) She would like to go back to the Adderall XR 30 mg PO every day at this time  Refills sent in for 30 days x 3 months  Follow up in 3 months or sooner if concerns arise        Relevant Medications   amphetamine-dextroamphetamine (ADDERALL XR) 30 MG 24 hr capsule   amphetamine-dextroamphetamine (ADDERALL XR) 30 MG 24 hr capsule (Start on 04/04/2023)   amphetamine-dextroamphetamine (ADDERALL XR) 30 MG 24 hr capsule (Start on 05/05/2023)   Other Visit Diagnoses     Acute bilateral thoracic back pain       Relevant Medications   meloxicam (MOBIC) 15 MG tablet   Acute bilateral low back pain without  sciatica       Relevant Medications   meloxicam (MOBIC) 15 MG tablet        Return in about 3 months (around 06/04/2023) for ADHD.   I, Zainah Steven E Lindalee Huizinga, PA-C, have reviewed all documentation for this visit. The documentation on 03/04/23 for the exam, diagnosis, procedures, and orders are all accurate and complete.   Emmily Pellegrin, MHS, PA-C  [facility]

## 2023-03-04 NOTE — Assessment & Plan Note (Addendum)
Chronic, historic condition Reports she has not had medication for about a week  She states the change to the short release was okay but she noticed that she started having issues with attention earlier in the day (focus did not last as long as she needed it to) She would like to go back to the Adderall XR 30 mg PO every day at this time  Refills sent in for 30 days x 3 months  Follow up in 3 months or sooner if concerns arise

## 2023-04-23 ENCOUNTER — Other Ambulatory Visit: Payer: Self-pay | Admitting: Nurse Practitioner

## 2023-04-23 DIAGNOSIS — F909 Attention-deficit hyperactivity disorder, unspecified type: Secondary | ICD-10-CM

## 2023-04-23 NOTE — Telephone Encounter (Signed)
Medication Refill - Medication: amphetamine-dextroamphetamine (ADDERALL XR) 30 MG 24 hr capsule [[id number]]  ENDED   Has the patient contacted their pharmacy? Yes.   (Agent: If no, request that the patient contact the pharmacy for the refill. If patient does not wish to contact the pharmacy document the reason why and proceed with request.) (Agent: If yes, when and what did the pharmacy advise?)  Preferred Pharmacy (with phone number or street name):  [facility] [address] [place] - [address] Phone: [phone]  Fax: [phone]     Has the patient been seen for an appointment in the last year OR does the patient have an upcoming appointment? Yes.    Agent: Please be advised that RX refills may take up to 3 business days. We ask that you follow-up with your pharmacy.

## 2023-04-26 NOTE — Telephone Encounter (Signed)
Requested medication (s) are due for refill today: yes  Requested medication (s) are on the active medication list: yes  Last refill:  03/04/23  Future visit scheduled: yes  Notes to clinic:  Unable to refill per protocol, cannot delegate.      Requested Prescriptions  Pending Prescriptions Disp Refills   amphetamine-dextroamphetamine (ADDERALL XR) 30 MG 24 hr capsule 30 capsule 0    Sig: Take 1 capsule (30 mg total) by mouth daily.     Not Delegated - Psychiatry:  Stimulants/ADHD Failed - 04/23/2023  5:59 PM      Failed - This refill cannot be delegated      Passed - Urine Drug Screen completed in last 360 days      Passed - Last BP in normal range    BP Readings from Last 1 Encounters:  03/04/23 131/86         Passed - Last Heart Rate in normal range    Pulse Readings from Last 1 Encounters:  03/04/23 90         Passed - Valid encounter within last 6 months    Recent Outpatient Visits           1 month ago Attention deficit hyperactivity disorder (ADHD), unspecified ADHD type   [facility] Mecum, Erin E, PA-C   3 months ago Acute bilateral thoracic back pain   [facility] Mecum, Oswaldo Conroy, PA-C   4 months ago Annual physical exam   [facility] Larae Grooms, NP   7 months ago Congestion of nasal sinus   [facility] Larae Grooms, NP   10 months ago Attention deficit hyperactivity disorder (ADHD), unspecified ADHD type   [facility] Larae Grooms, NP       Future Appointments             In 1 month Larae Grooms, NP  [facility]

## 2023-05-04 ENCOUNTER — Ambulatory Visit
Admission: RE | Admit: 2023-05-04 | Discharge: 2023-05-04 | Disposition: A | Discharge status: Home / Self Care | Payer: 59 | Source: Ambulatory Visit | Attending: Emergency Medicine | Admitting: Emergency Medicine

## 2023-05-04 VITALS — BP 150/86 | HR 70 | Temp 98.6°F | Resp 18

## 2023-05-04 DIAGNOSIS — R0789 Other chest pain: Secondary | ICD-10-CM

## 2023-05-04 DIAGNOSIS — J069 Acute upper respiratory infection, unspecified: Secondary | ICD-10-CM | POA: Diagnosis not present

## 2023-05-04 MED ORDER — AEROCHAMBER MV MISC: 1 refills | Status: AC

## 2023-05-04 MED ORDER — NAPROXEN 500 MG PO TABS: 500.0000 mg | ORAL_TABLET | Freq: Two times a day (BID) | ORAL | 0 refills | Status: AC

## 2023-05-04 MED ORDER — ALBUTEROL SULFATE HFA 108 (90 BASE) MCG/ACT IN AERS
1.0000 | INHALATION_SPRAY | RESPIRATORY_TRACT | 0 refills | Status: AC | PRN
Start: 2023-05-04 — End: ?

## 2023-05-04 MED ORDER — PROMETHAZINE-DM 6.25-15 MG/5ML PO SYRP: 5.0000 mL | ORAL_SOLUTION | Freq: Four times a day (QID) | ORAL | 0 refills | Status: DC | PRN

## 2023-05-04 MED ORDER — PREDNISONE 20 MG PO TABS: 40.0000 mg | ORAL_TABLET | Freq: Every day | ORAL | 0 refills | Status: AC

## 2023-05-04 NOTE — ED Triage Notes (Signed)
Pt presents with a non-productive cough and congestion x 1 week

## 2023-05-04 NOTE — ED Provider Notes (Signed)
HPI  SUBJECTIVE:  Jennifer Whitney is a 62 y.o. female who presents with over a week of a productive cough, chest/upper back soreness secondary to the cough some wheezing, nasal congestion, clear rhinorrhea, postnasal drip.  She states that the cough is improving.  No fevers, shortness of breath, dyspnea on exertion, sinus pain or pressure, facial swelling, upper dental pain.  No GERD or allergy symptoms.  She works at a daycare, and has been exposed to multiple children with upper respiratory infections.  She is unable to sleep at night because of the cough.  No antipyretic in the past 6 hours.  No antibiotics in the past 3 hours.  She has been taking DayQuil and NyQuil with improvement in her symptoms.  Her cough is worse at night. Patient has a past medical history of hypothyroidism, pneumonia, allergic rhinitis, allergy induced asthma.  No history of GERD.  She is not a smoker.  Past Medical History:  Diagnosis Date   Acquired hypothyroidism 11/25/2015   ADHD    ADHD (attention deficit hyperactivity disorder), combined type 07/20/2018   Evaluation by Dr. 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;  Service: Endoscopy;  Laterality: N/A;   TOTAL ABDOMINAL HYSTERECTOMY     TOTAL KNEE ARTHROPLASTY Right 01/29/2022   Procedure: TOTAL KNEE ARTHROPLASTY;  Surgeon: Juanell Fairly, MD;  Location: [facility];  Service: Orthopedics;  Laterality: Right;   TUBAL LIGATION      Family History  Problem Relation Age of Onset   Heart failure Mother    Lung cancer Mother    Heart disease Mother    Stroke Father    Heart attack Father    Hypertension Sister    Hypertension Brother    Arrhythmia Daughter        SVT   Heart disease Paternal Grandmother    Emphysema Paternal Grandfather    Hypertension Sister    Prostate cancer Neg Hx    Kidney cancer Neg Hx    Breast cancer Neg Hx     Social History   Tobacco Use   Smoking status: Former    Current packs/day: 0.00    Average packs/day: 1 pack/day for 8.0 years (8.0 ttl pk-yrs)    Types: Cigarettes    Start date: 08/10/1977    Quit date: 08/1985    Years since quitting: 37.7   Smokeless tobacco: Never  Vaping Use   Vaping status: Never Used  Substance Use Topics   Alcohol use: Yes    Alcohol/week: 1.0 standard drink of alcohol    Types: 1 Glasses of wine per week   Drug use: No  No current facility-administered medications for this encounter.  Current Outpatient Medications:    albuterol (VENTOLIN HFA) 108 (90 Base) MCG/ACT inhaler, Inhale 1-2 puffs into the lungs every 4 (four) hours as needed for wheezing or shortness of breath., Disp: 1 each, Rfl: 0   naproxen (NAPROSYN) 500 MG tablet, Take 1 tablet (500 mg total) by mouth 2 (two) times daily., Disp: 20 tablet, Rfl: 0   predniSONE (DELTASONE) 20 MG tablet, Take 2 tablets (40 mg total) by mouth daily with breakfast for 5 days., Disp: 10 tablet, Rfl: 0   promethazine-dextromethorphan (PROMETHAZINE-DM) 6.25-15 MG/5ML syrup, Take 5 mLs by mouth 4 (four) times daily as needed for cough., Disp: 118 mL, Rfl: 0   Spacer/Aero-Holding Chambers (AEROCHAMBER MV) inhaler, Use  as instructed, Disp: 1 each, Rfl: 1   acetaminophen (TYLENOL) 325 MG tablet, Take by mouth., Disp: , Rfl:    amphetamine-dextroamphetamine (ADDERALL XR) 30 MG 24 hr capsule, Take 1 capsule (30 mg total) by mouth daily., Disp: 30 capsule, Rfl: 0   amphetamine-dextroamphetamine (ADDERALL XR) 30 MG 24 hr capsule, Take 1 capsule (30 mg total) by mouth daily., Disp: 30 capsule, Rfl: 0   [START ON 05/05/2023] amphetamine-dextroamphetamine (ADDERALL XR) 30 MG 24 hr capsule, Take 1 capsule (30 mg total) by mouth daily., Disp: 30 capsule, Rfl: 0   citalopram (CELEXA) 20 MG tablet, Take 1 tablet by mouth once daily, Disp: 90 tablet, Rfl: 1   fluticasone (FLONASE) 50 MCG/ACT nasal spray, Place 2 sprays into both nostrils daily., Disp: 11.1 mL, Rfl: 1   levothyroxine (SYNTHROID) 100 MCG tablet, Take 1 tablet by mouth once daily, Disp: 90 tablet, Rfl: 1   loratadine (CLARITIN) 10 MG tablet, Take 10 mg by mouth daily., Disp: , Rfl:    methocarbamol (ROBAXIN-750) 750 MG tablet, Take 1 tablet (750 mg total) by mouth every 8 (eight) hours as needed for muscle spasms. (Patient not taking: Reported on 03/04/2023), Disp: 30 tablet, Rfl: 0   rosuvastatin (CRESTOR) 5 MG tablet, Take 1 tablet (5 mg total) by mouth daily., Disp: 90 tablet, Rfl: 1  No Known Allergies   ROS  As noted in HPI.   Physical Exam  BP (!) 150/86 (BP Location: Right Arm)   Pulse 70   Temp 98.6 F (37 C) (Oral)   Resp 18   SpO2 99%   Constitutional: Well developed, well nourished, no acute distress Eyes:  EOMI, conjunctiva normal bilaterally HENT: Normocephalic, atraumatic,mucus membranes moist.  Positive nasal congestion.  Erythematous, swollen turbinates.  No maxillary, frontal sinus tenderness.  Positive cobblestoning.  No obvious postnasal drip. Respiratory: Normal inspiratory effort, lungs clear bilaterally.  Anterior chest wall tenderness. Cardiovascular: Normal rate, regular rhythm, no murmurs rubs or gallops. GI:  nondistended skin: No rash, skin intact Musculoskeletal: no deformities Neurologic: Alert & oriented x 3, no focal neuro deficits Psychiatric: Speech and behavior appropriate   ED Course   Medications - No data to display  No orders of the defined types were placed in this encounter.   No results found for this or any previous visit (from the past 24 hour(s)). No results found.  ED Clinical Impression  1. Viral URI with cough   2. Musculoskeletal chest pain      ED Assessment/Plan      Patient states that the cough is improving.  Lungs are clear, she is afebrile, and satting well on room air.   in the absence of fevers, believe pneumonia is less likely.  I offered to get a  chest x-ray today to evaluate for pneumonia,, but patient would like to try treating supportively first, and if she gets worse or does not improve, then she will return here or see her PCP for chest x-ray.  She has no evidence of a bacterial sinusitis.  Will send home with prednisone 40 mg for 5 days, and albuterol inhaler with a spacer given her history of asthma.  She is to start Flonase, saline nasal irrigation, Mucinex D.  Will send home with Naprosyn/Tylenol, she is to discontinue meloxicam.  Promethazine DM for cough.  Follow-up here or with her PCP as needed.  Discussed MDM, treatment plan, and plan for follow-up with patient. patient agrees with plan.   Meds ordered this encounter  Medications   naproxen (NAPROSYN) 500 MG tablet    Sig: Take 1 tablet (500 mg total) by mouth 2 (two) times daily.    Dispense:  20 tablet    Refill:  0   promethazine-dextromethorphan (PROMETHAZINE-DM) 6.25-15 MG/5ML syrup    Sig: Take 5 mLs by mouth 4 (four) times daily as needed for cough.    Dispense:  118 mL    Refill:  0   predniSONE (DELTASONE) 20 MG tablet    Sig: Take 2 tablets (40 mg total) by mouth daily with breakfast for 5 days.    Dispense:  10 tablet    Refill:  0   Spacer/Aero-Holding Chambers  (AEROCHAMBER MV) inhaler    Sig: Use as instructed    Dispense:  1 each    Refill:  1   albuterol (VENTOLIN HFA) 108 (90 Base) MCG/ACT inhaler    Sig: Inhale 1-2 puffs into the lungs every 4 (four) hours as needed for wheezing or shortness of breath.    Dispense:  1 each    Refill:  0      *This clinic note was created using Scientist, clinical (histocompatibility and immunogenetics). Therefore, there may be occasional mistakes despite careful proofreading.  ?    Domenick Gong, MD 05/05/23 [id number]

## 2023-05-04 NOTE — Discharge Instructions (Signed)
Finish the prednisone, 2 puffs from albuterol inhaler using your spacer every 4-6 hours.  This will help with inflammation in your lungs and open them up so that you can breathe easier..  Meloxicam, start Naprosyn, 1000 mg of Tylenol twice a day.  Start Flonase, saline nasal irrigation with a NeilMed sinus rinse and distilled water as often as you want, and Mucinex D for the nasal congestion.  Promethazine DM as needed for cough.  Return here or follow-up with your primary care provider as needed, especially if you are not getting any better or if you get worse for chest x-ray.

## 2023-05-05 ENCOUNTER — Ambulatory Visit: Payer: 59 | Admitting: Pediatrics

## 2023-05-05 NOTE — Progress Notes (Deleted)
Acute Visit  There were no vitals taken for this visit.   Subjective:    Patient ID: Jennifer Whitney, female    DOB: 11/13/1960, 61 y.o.   MRN: [id number]  HPI: Jennifer Whitney is a 62 y.o. female  No chief complaint on file.   Relevant past medical, surgical, family and social history reviewed and updated as indicated. Interim medical history since our last visit reviewed. Allergies and medications reviewed and updated.  ROS per HPI unless specifically indicated above     Objective:    There were no vitals taken for this visit.  Wt Readings from Last 3 Encounters:  03/04/23 207 lb 12.8 oz (94.3 kg)  01/15/23 207 lb 6.4 oz (94.1 kg)  12/23/22 209 lb 8 oz (95 kg)     Physical Exam      Assessment & Plan:  Assessment & Plan   There are no diagnoses linked to this encounter.    Follow up plan: No follow-ups on file.  Analiah Drum Howell Pringle, MD

## 2023-05-16 ENCOUNTER — Other Ambulatory Visit: Payer: Self-pay | Admitting: Physician Assistant

## 2023-05-16 DIAGNOSIS — F909 Attention-deficit hyperactivity disorder, unspecified type: Secondary | ICD-10-CM

## 2023-05-23 NOTE — Progress Notes (Deleted)
There were no vitals taken for this visit.   Subjective:    Patient ID: Jennifer Whitney, female    DOB: Jan 26, 1961, 62 y.o.   MRN: [id number]  HPI: Jennifer Whitney is a 62 y.o. female  No chief complaint on file.   Relevant past medical, surgical, family and social history reviewed and updated as indicated. Interim medical history since our last visit reviewed. Allergies and medications reviewed and updated.  Review of Systems  Per HPI unless specifically indicated above     Objective:    There were no vitals taken for this visit.  Wt Readings from Last 3 Encounters:  03/04/23 207 lb 12.8 oz (94.3 kg)  01/15/23 207 lb 6.4 oz (94.1 kg)  12/23/22 209 lb 8 oz (95 kg)    Physical Exam  Results for orders placed or performed in visit on 12/23/22  Microscopic Examination   Urine  Result Value Ref Range   WBC, UA 0-5 0 - 5 /hpf   RBC, Urine 0-2 0 - 2 /hpf   Epithelial Cells (non renal) 0-10 0 - 10 /hpf   Mucus, UA Present (A) Not Estab.   Bacteria, UA None seen None seen/Few  Urine Culture   Specimen: Urine   UR  Result Value Ref Range   Urine Culture, Routine Final report (A)    Organism ID, Bacteria Comment (A)    ORGANISM ID, BACTERIA Comment   CBC with Differential/Platelet  Result Value Ref Range   WBC 5.2 3.4 - 10.8 x10E3/uL   RBC 4.44 3.77 - 5.28 x10E6/uL   Hemoglobin 14.8 11.1 - 15.9 g/dL   Hematocrit 86.5 78.4 - 46.6 %   MCV 96 79 - 97 fL   MCH 33.3 (H) 26.6 - 33.0 pg   MCHC 34.9 31.5 - 35.7 g/dL   RDW 69.6 29.5 - 28.4 %   Platelets 262 150 - 450 x10E3/uL   Neutrophils 58 Not Estab. %   Lymphs 29 Not Estab. %   Monocytes 9 Not Estab. %   Eos 3 Not Estab. %   Basos 1 Not Estab. %   Neutrophils Absolute 3.0 1.4 - 7.0 x10E3/uL   Lymphocytes Absolute 1.5 0.7 - 3.1 x10E3/uL   Monocytes Absolute 0.5 0.1 - 0.9 x10E3/uL   EOS (ABSOLUTE) 0.2 0.0 - 0.4 x10E3/uL   Basophils Absolute 0.1 0.0 - 0.2 x10E3/uL   Immature Granulocytes 0 Not Estab. %   Immature  Grans (Abs) 0.0 0.0 - 0.1 x10E3/uL  Comprehensive metabolic panel  Result Value Ref Range   Glucose 89 70 - 99 mg/dL   BUN 14 8 - 27 mg/dL   Creatinine, Ser 1.32 0.57 - 1.00 mg/dL   eGFR 80 >44 WN/UUV/2.53   BUN/Creatinine Ratio 17 12 - 28   Sodium 138 134 - 144 mmol/L   Potassium 4.5 3.5 - 5.2 mmol/L   Chloride 100 96 - 106 mmol/L   CO2 25 20 - 29 mmol/L   Calcium 9.3 8.7 - 10.3 mg/dL   Total Protein 6.5 6.0 - 8.5 g/dL   Albumin 4.4 3.8 - 4.9 g/dL   Globulin, Total 2.1 1.5 - 4.5 g/dL   Albumin/Globulin Ratio 2.1 1.2 - 2.2   Bilirubin Total <0.2 0.0 - 1.2 mg/dL   Alkaline Phosphatase 129 (H) 44 - 121 IU/L   AST 22 0 - 40 IU/L   ALT 23 0 - 32 IU/L  Lipid panel  Result Value Ref Range   Cholesterol, Total 197 100 - 199  mg/dL   Triglycerides 161 (H) 0 - 149 mg/dL   HDL 34 (L) >09 mg/dL   VLDL Cholesterol Cal 30 5 - 40 mg/dL   LDL Chol Calc (NIH) 604 (H) 0 - 99 mg/dL   Chol/HDL Ratio 5.8 (H) 0.0 - 4.4 ratio  TSH  Result Value Ref Range   TSH 3.210 0.450 - 4.500 uIU/mL  Urinalysis, Routine w reflex microscopic  Result Value Ref Range   Specific Gravity, UA 1.025 1.005 - 1.030   pH, UA 6.5 5.0 - 7.5   Color, UA Yellow Yellow   Appearance Ur Cloudy (A) Clear   Leukocytes,UA 1+ (A) Negative   Protein,UA Negative Negative/Trace   Glucose, UA Negative Negative   Ketones, UA Negative Negative   RBC, UA Negative Negative   Bilirubin, UA Negative Negative   Urobilinogen, Ur 0.2 0.2 - 1.0 mg/dL   Nitrite, UA Negative Negative   Microscopic Examination See below:   T4, free  Result Value Ref Range   Free T4 1.28 0.82 - 1.77 ng/dL      Assessment & Plan:   Problem List Items Addressed This Visit   None    Follow up plan: No follow-ups on file.

## 2023-05-24 ENCOUNTER — Ambulatory Visit: Payer: 59 | Admitting: Nurse Practitioner

## 2023-05-26 NOTE — Telephone Encounter (Signed)
Errounous encounter

## 2023-05-29 ENCOUNTER — Encounter: Payer: Self-pay | Admitting: Emergency Medicine

## 2023-05-29 ENCOUNTER — Ambulatory Visit (INDEPENDENT_AMBULATORY_CARE_PROVIDER_SITE_OTHER): Payer: 59

## 2023-05-29 ENCOUNTER — Ambulatory Visit
Admission: EM | Admit: 2023-05-29 | Discharge: 2023-05-29 | Disposition: A | Discharge status: Home / Self Care | Payer: 59 | Attending: Family Medicine | Admitting: Family Medicine

## 2023-05-29 DIAGNOSIS — R0782 Intercostal pain: Secondary | ICD-10-CM

## 2023-05-29 DIAGNOSIS — M545 Low back pain, unspecified: Secondary | ICD-10-CM

## 2023-05-29 DIAGNOSIS — M546 Pain in thoracic spine: Secondary | ICD-10-CM

## 2023-05-29 DIAGNOSIS — S20211A Contusion of right front wall of thorax, initial encounter: Secondary | ICD-10-CM | POA: Diagnosis not present

## 2023-05-29 DIAGNOSIS — J9811 Atelectasis: Secondary | ICD-10-CM | POA: Diagnosis not present

## 2023-05-29 DIAGNOSIS — W19XXXA Unspecified fall, initial encounter: Secondary | ICD-10-CM | POA: Diagnosis not present

## 2023-05-29 DIAGNOSIS — R0781 Pleurodynia: Secondary | ICD-10-CM | POA: Diagnosis not present

## 2023-05-29 DIAGNOSIS — S298XXA Other specified injuries of thorax, initial encounter: Secondary | ICD-10-CM

## 2023-05-29 MED ORDER — METHOCARBAMOL 750 MG PO TABS
750.0000 mg | ORAL_TABLET | Freq: Three times a day (TID) | ORAL | 0 refills | Status: AC | PRN
Start: 2023-05-29 — End: ?

## 2023-05-29 MED ORDER — OXYCODONE-ACETAMINOPHEN 5-325 MG PO TABS
1.0000 | ORAL_TABLET | Freq: Three times a day (TID) | ORAL | 0 refills | Status: AC | PRN
Start: 2023-05-29 — End: ?

## 2023-05-29 NOTE — Discharge Instructions (Addendum)
On my review of your xray images, your images were not concerning for rib fractures. The radiologist has not yet read your xray. If it is significantly abnormal or urgent, someone will contact you.  You should see your results in MyChart.   If medication was prescribed, stop by the pharmacy to pick up your prescriptions.  For your  pain, Take 1500 mg Tylenol twice a day, take muscle relaxer (methocarbamol/Robaxin) as needed for pain.  Continue taking the meloxicam previously prescribed.  I prescribed some Percocet for additional pain relief.  Do not drive or operate heavy machinery with taking this medication.  Apply cold compresses intermittently, as needed.  As pain recedes, begin normal activities slowly as tolerated.  Your pain may last up to 3 months.  It should gradually improve.  To prevent pneumonia, take 10 deep breaths every hour while awake.  Watch for worsening symptoms such as an increasing weakness or loss of sensation, increasing pain and/or the loss of bladder or bowel function. Should any of these occur, go to the emergency department immediately.

## 2023-05-29 NOTE — ED Triage Notes (Signed)
Patient states that she fell last night while trying climb through a window.  Patient reports right sided rib pain and pain when taking a deep breath.

## 2023-05-29 NOTE — ED Provider Notes (Signed)
[facility]    CSN: [id number] Arrival date & time: 05/29/23  1105      History   Chief Complaint Chief Complaint  Patient presents with   Fall   RIb Pain    HPI  HPI Jennifer Whitney is a 62 y.o. female.   [name] presents after fall for right rib pain. She didn't have her house key and tried to jump into her bedroom window. She was standing on a chair on the trash can. She tried to use a brick to stop the trans can from rolling.  When she jumped she landed on the window edge. She didn't have a ladder to use. States she has jumped successfully before.   Pain rated 9/10 with move.  She didn't hear any abnormal pops or sounds. Cotninues to have pain especially with deep breathing. Has some anterior abdominal pain as well.     Past Medical History:  Diagnosis Date   Acquired hypothyroidism 11/25/2015   ADHD    ADHD (attention deficit hyperactivity disorder), combined type 07/20/2018   Evaluation by Dr. 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    Patient Active Problem List   Diagnosis Date Noted   Internal hemorrhoid 03/12/2022   S/P TKR (total knee replacement) using cement, right 01/29/2022   Screening for tuberculosis 12/11/2021   Annual physical exam 12/11/2021   Hypothyroidism 09/15/2021   Chronic pain of right knee 01/02/2021   Family history of heart disease 08/02/2018   Mixed hyperlipidemia 08/02/2018   Attention deficit disorder 07/20/2018   Polyp of sigmoid colon    Arthralgia 01/31/2018   Obesity  (BMI 30.0-34.9) 01/31/2018   Chronic back pain 06/28/2017   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   Allergic rhinitis 06/14/2017   Depression, major, single episode, complete remission (HCC) 06/14/2017   Menopausal syndrome (hot flashes) 06/14/2017   Urinary incontinence in female 06/14/2017   Acquired hypothyroidism 11/25/2015    Past Surgical History:  Procedure Laterality Date   Bladder Mesh  2014   COLONOSCOPY WITH PROPOFOL N/A 04/22/2018   Procedure: COLONOSCOPY WITH PROPOFOL;  Surgeon: Midge Minium, MD;  Location: [facility];  Service: Endoscopy;  Laterality: N/A;   POLYPECTOMY N/A 04/22/2018   Procedure: POLYPECTOMY;  Surgeon: Midge Minium, MD;  Location: [facility];  Service: Endoscopy;  Laterality: N/A;   TOTAL ABDOMINAL HYSTERECTOMY     TOTAL KNEE ARTHROPLASTY Right 01/29/2022   Procedure: TOTAL KNEE ARTHROPLASTY;  Surgeon: Juanell Fairly, MD;  Location: [facility];  Service: Orthopedics;  Laterality: Right;   TUBAL LIGATION      OB History   No obstetric history on file.      Home Medications    Prior to Admission medications   Medication Sig Start Date End Date Taking? Authorizing Provider  citalopram (CELEXA) 20 MG tablet Take 1 tablet by mouth once daily 12/14/22  Yes Larae Grooms, NP  fluticasone (FLONASE) 50 MCG/ACT nasal spray Place 2 sprays into both nostrils daily. 12/11/21 05/29/23 Yes Vigg, Avanti, MD  levothyroxine (SYNTHROID) 100 MCG tablet Take 1 tablet by mouth once daily 10/13/22  Yes Larae Grooms, NP  loratadine (CLARITIN) 10 MG tablet Take 10 mg by mouth daily.   Yes [provider]  meloxicam (MOBIC) 15 MG tablet Take 15 mg by mouth daily.   Yes [provider]  oxyCODONE-acetaminophen (PERCOCET/ROXICET) 5-325 MG tablet Take 1 tablet by mouth every 8 (eight) hours as needed for severe pain (pain score 7-10). 05/29/23  Yes Mikayla Chiusano, DO  rosuvastatin (CRESTOR) 5 MG tablet Take 1 tablet (5 mg total) by mouth  daily. 12/24/22  Yes Larae Grooms, NP  acetaminophen (TYLENOL) 325 MG tablet Take by mouth. 07/08/20   [provider]  albuterol (VENTOLIN HFA) 108 (90 Base) MCG/ACT inhaler Inhale 1-2 puffs into the lungs every 4 (four) hours as needed for wheezing or shortness of breath. 05/04/23   Domenick Gong, MD  amphetamine-dextroamphetamine (ADDERALL XR) 30 MG 24 hr capsule Take 1 capsule (30 mg total) by mouth daily. 03/04/23 04/03/23  Mecum, Erin E, PA-C  amphetamine-dextroamphetamine (ADDERALL XR) 30 MG 24 hr capsule Take 1 capsule (30 mg total) by mouth daily. 04/04/23 05/04/23  Mecum, Erin E, PA-C  amphetamine-dextroamphetamine (ADDERALL XR) 30 MG 24 hr capsule Take 1 capsule (30 mg total) by mouth daily. 05/05/23 06/04/23  Mecum, Oswaldo Conroy, PA-C  methocarbamol (ROBAXIN-750) 750 MG tablet Take 1 tablet (750 mg total) by mouth every 8 (eight) hours as needed for muscle spasms. 05/29/23   Lulabelle Desta, Seward Meth, DO  naproxen (NAPROSYN) 500 MG tablet Take 1 tablet (500 mg total) by mouth 2 (two) times daily. 05/04/23   Domenick Gong, MD  Spacer/Aero-Holding Chambers (AEROCHAMBER MV) inhaler Use as instructed 05/04/23   Domenick Gong, MD    Family History Family History  Problem Relation Age of Onset   Heart failure Mother    Lung cancer Mother    Heart disease Mother    Stroke Father    Heart attack Father    Hypertension Sister    Hypertension Brother    Arrhythmia Daughter        SVT   Heart disease Paternal Grandmother    Emphysema Paternal Grandfather    Hypertension Sister    Prostate cancer Neg Hx    Kidney cancer Neg Hx    Breast cancer Neg Hx     Social History Social History   Tobacco Use   Smoking status: Former    Current packs/day: 0.00    Average packs/day: 1 pack/day for 8.0 years (8.0 ttl pk-yrs)    Types: Cigarettes    Start date: 08/10/1977    Quit date: 08/1985    Years since quitting: 37.8   Smokeless tobacco: Never  Vaping Use   Vaping status: Never Used   Substance Use Topics   Alcohol use: Yes    Alcohol/week: 1.0 standard drink of alcohol    Types: 1 Glasses of wine per week   Drug use: No     Allergies   Patient has no known allergies.   Review of Systems Review of Systems: :negative unless otherwise stated in HPI.      Physical Exam Triage Vital Signs ED Triage Vitals  Encounter Vitals Group     BP 05/29/23 1158 (!) 160/88     Systolic BP Percentile --      Diastolic BP Percentile --      Pulse Rate 05/29/23 1158 64     Resp 05/29/23 1158 14     Temp 05/29/23 1158  98 F (36.7 C)     Temp Source 05/29/23 1158 Oral     SpO2 05/29/23 1158 98 %     Weight 05/29/23 1155 200 lb (90.7 kg)     Height 05/29/23 1155 5\' 7"  (1.702 m)     Head Circumference --      Peak Flow --      Pain Score 05/29/23 1155 9     Pain Loc --      Pain Education --      Exclude from Growth Chart --    No data found.  Updated Vital Signs BP (!) 160/88 (BP Location: Left Arm)   Pulse 64   Temp 98 F (36.7 C) (Oral)   Resp 14   Ht 5\' 7"  (1.702 m)   Wt 90.7 kg   SpO2 98%   BMI 31.32 kg/m   Visual Acuity Right Eye Distance:   Left Eye Distance:   Bilateral Distance:    Right Eye Near:   Left Eye Near:    Bilateral Near:     Physical Exam GEN: well appearing female in no acute distress  CVS: well perfused  RESP: speaking in full sentences without pause, no respiratory distress  MSK:  No evidence of bony deformity, asymmetry, or muscle atrophy. No overlying skin changes.  TTP overlying the mid to lower anterior and lateral ribs  Full active ROM of thoracic spine.  Sensation intact. Peripheral pulses intact.   UC Treatments / Results  Labs (all labs ordered are listed, but only abnormal results are displayed) Labs Reviewed - No data to display  EKG   Radiology DG Ribs Unilateral W/Chest Right  Result Date: 05/29/2023 CLINICAL DATA:  Larey Seat last evening.  Right-sided rib pain. EXAM: RIGHT RIBS AND CHEST - 3+ VIEW  COMPARISON:  None Available. FINDINGS: The cardiac silhouette, mediastinal and hilar contours are normal. Streaky bibasilar subsegmental atelectasis but no infiltrates, effusions or pneumothorax. Dedicated views of the right ribs do not demonstrate any rib fractures. No pleural thickening. IMPRESSION: 1. Streaky bibasilar subsegmental atelectasis. 2. No right-sided rib fractures. Electronically Signed   By: Rudie Meyer M.D.   On: 05/29/2023 13:37     Procedures Procedures (including critical care time)  Medications Ordered in UC Medications - No data to display  Initial Impression / Assessment and Plan / UC Course  I have reviewed the triage vital signs and the nursing notes.  Pertinent labs & imaging results that were available during my care of the patient were reviewed by me and considered in my medical decision making (see chart for details).      Pt is a 62 y.o.  female with acute right rib pain after a fall through a window a couple days ago. On exam, pt has tenderness at anterior and lateral mid to lower ribs concerning for fracture.   Obtained chest and right rib plain films.  Personally interpreted by me were unremarkable for fracture or dislocation. Suspect bone contusion.  Patient aware the radiologist has not read her xray and is comfortable with the preliminary read by me. Will review radiologist read when available and call patient if a change in plan is warranted.  Pt agreeable to this plan prior to discharge.   Patient to gradually return to normal activities, as tolerated and continue ordinary activities within the limits permitted by pain. Prescribed Percocet and muscle relaxer  for pain relief.  Tylenol PRN.   Patient to follow up with primary care provider, if symptoms  do not improve with conservative treatment.  Return and ED precautions given. Understanding voiced. Discussed MDM, treatment plan and plan for follow-up with patient who agrees with plan.   Radiologist  impression reviewed.   Final Clinical Impressions(s) / UC Diagnoses   Final diagnoses:  Fall, initial encounter  Contusion of rib on right side, initial encounter     Discharge Instructions      On my review of your xray images, your images were not concerning for rib fractures. The radiologist has not yet read your xray. If it is significantly abnormal or urgent, someone will contact you.  You should see your results in MyChart.   If medication was prescribed, stop by the pharmacy to pick up your prescriptions.  For your  pain, Take 1500 mg Tylenol twice a day, take muscle relaxer (methocarbamol/Robaxin) as needed for pain.  Continue taking the meloxicam previously prescribed.  I prescribed some Percocet for additional pain relief.  Do not drive or operate heavy machinery with taking this medication.  Apply cold compresses intermittently, as needed.  As pain recedes, begin normal activities slowly as tolerated.  Your pain may last up to 3 months.  It should gradually improve.  To prevent pneumonia, take 10 deep breaths every hour while awake.  Watch for worsening symptoms such as an increasing weakness or loss of sensation, increasing pain and/or the loss of bladder or bowel function. Should any of these occur, go to the emergency department immediately.         ED Prescriptions     Medication Sig Dispense Auth. Provider   oxyCODONE-acetaminophen (PERCOCET/ROXICET) 5-325 MG tablet Take 1 tablet by mouth every 8 (eight) hours as needed for severe pain (pain score 7-10). 12 tablet Sadiq Mccauley, DO   methocarbamol (ROBAXIN-750) 750 MG tablet Take 1 tablet (750 mg total) by mouth every 8 (eight) hours as needed for muscle spasms. 30 tablet Damia Bobrowski, Seward Meth, DO      I have reviewed the PDMP during this encounter.   Katha Cabal, DO 05/29/23 1634

## 2023-06-07 ENCOUNTER — Ambulatory Visit: Payer: 59 | Admitting: Nurse Practitioner

## 2023-06-07 NOTE — Progress Notes (Deleted)
There were no vitals taken for this visit.   Subjective:    Patient ID: Jennifer Whitney, female    DOB: 06-Dec-1960, 62 y.o.   MRN: [id number]  HPI: Jennifer Whitney is a 62 y.o. female  No chief complaint on file.  HYPERLIPIDEMIA Hyperlipidemia status: excellent compliance Satisfied with current treatment?  yes Side effects:  no Medication compliance: excellent compliance Past cholesterol meds: none Supplements: none Aspirin:  no The 10-year ASCVD risk score (Arnett DK, et al., 2019) is: 5.8%   Values used to calculate the score:     Age: 71 years     Sex: Female     Is Non-Hispanic African American: No     Diabetic: No     Tobacco smoker: No     Systolic Blood Pressure: 152 mmHg     Is BP treated: No     HDL Cholesterol: 41 mg/dL     Total Cholesterol: 209 mg/dL Chest pain:  no Coronary artery disease:  no Family history CAD:  no Family history early CAD:  no  ADHD FOLLOW UP ADHD status: Patient states she is having trouble sleeping. She is wanting to go to a regular release due to having trouble sleeping.   Satisfied with current therapy: no Medication compliance:  excellent compliance Controlled substance contract: yes Previous psychiatry evaluation: yes Previous medications: yes adderall XR   Taking meds on weekends/vacations: occasionally Work/school performance:  good Difficulty sustaining attention/completing tasks: yes Distracted by extraneous stimuli: yes Does not listen when spoken to: yes  Fidgets with hands or feet: yes Unable to stay in seat: no Blurts out/interrupts others: yes ADHD Medication Side Effects: no    Decreased appetite: no    Headache: no    Sleeping disturbance pattern: no    Irritability: no    Rebound effects (worse than baseline) off medication: no    Anxiousness: no    Dizziness: no    Tics: no  HYPOTHYROIDISM Thyroid control status:controlled Satisfied with current treatment? yes Medication side effects: no Medication  compliance: excellent compliance Etiology of hypothyroidism:  Recent dose adjustment:no Fatigue: yes Cold intolerance: no Heat intolerance: yes Weight gain: yes Weight loss: no Constipation: no Diarrhea/loose stools: no Palpitations: no Lower extremity edema: no Anxiety/depressed mood: no   MOOD Feels like the Celexa is working well for her.  Does have some days where she is down but that is very rare.  Denies SI.   Relevant past medical, surgical, family and social history reviewed and updated as indicated. Interim medical history since our last visit reviewed. Allergies and medications reviewed and updated.  Review of Systems  Per HPI unless specifically indicated above     Objective:    There were no vitals taken for this visit.  Wt Readings from Last 3 Encounters:  05/29/23 200 lb (90.7 kg)  03/04/23 207 lb 12.8 oz (94.3 kg)  01/15/23 207 lb 6.4 oz (94.1 kg)    Physical Exam  Results for orders placed or performed in visit on 12/23/22  Microscopic Examination   Urine  Result Value Ref Range   WBC, UA 0-5 0 - 5 /hpf   RBC, Urine 0-2 0 - 2 /hpf   Epithelial Cells (non renal) 0-10 0 - 10 /hpf   Mucus, UA Present (A) Not Estab.   Bacteria, UA None seen None seen/Few  Urine Culture   Specimen: Urine   UR  Result Value Ref Range   Urine Culture, Routine Final report (A)  Organism ID, Bacteria Comment (A)    ORGANISM ID, BACTERIA Comment   CBC with Differential/Platelet  Result Value Ref Range   WBC 5.2 3.4 - 10.8 x10E3/uL   RBC 4.44 3.77 - 5.28 x10E6/uL   Hemoglobin 14.8 11.1 - 15.9 g/dL   Hematocrit 16.1 09.6 - 46.6 %   MCV 96 79 - 97 fL   MCH 33.3 (H) 26.6 - 33.0 pg   MCHC 34.9 31.5 - 35.7 g/dL   RDW 04.5 40.9 - 81.1 %   Platelets 262 150 - 450 x10E3/uL   Neutrophils 58 Not Estab. %   Lymphs 29 Not Estab. %   Monocytes 9 Not Estab. %   Eos 3 Not Estab. %   Basos 1 Not Estab. %   Neutrophils Absolute 3.0 1.4 - 7.0 x10E3/uL   Lymphocytes Absolute  1.5 0.7 - 3.1 x10E3/uL   Monocytes Absolute 0.5 0.1 - 0.9 x10E3/uL   EOS (ABSOLUTE) 0.2 0.0 - 0.4 x10E3/uL   Basophils Absolute 0.1 0.0 - 0.2 x10E3/uL   Immature Granulocytes 0 Not Estab. %   Immature Grans (Abs) 0.0 0.0 - 0.1 x10E3/uL  Comprehensive metabolic panel  Result Value Ref Range   Glucose 89 70 - 99 mg/dL   BUN 14 8 - 27 mg/dL   Creatinine, Ser 9.14 0.57 - 1.00 mg/dL   eGFR 80 >78 GN/FAO/1.30   BUN/Creatinine Ratio 17 12 - 28   Sodium 138 134 - 144 mmol/L   Potassium 4.5 3.5 - 5.2 mmol/L   Chloride 100 96 - 106 mmol/L   CO2 25 20 - 29 mmol/L   Calcium 9.3 8.7 - 10.3 mg/dL   Total Protein 6.5 6.0 - 8.5 g/dL   Albumin 4.4 3.8 - 4.9 g/dL   Globulin, Total 2.1 1.5 - 4.5 g/dL   Albumin/Globulin Ratio 2.1 1.2 - 2.2   Bilirubin Total <0.2 0.0 - 1.2 mg/dL   Alkaline Phosphatase 129 (H) 44 - 121 IU/L   AST 22 0 - 40 IU/L   ALT 23 0 - 32 IU/L  Lipid panel  Result Value Ref Range   Cholesterol, Total 197 100 - 199 mg/dL   Triglycerides 865 (H) 0 - 149 mg/dL   HDL 34 (L) >78 mg/dL   VLDL Cholesterol Cal 30 5 - 40 mg/dL   LDL Chol Calc (NIH) 469 (H) 0 - 99 mg/dL   Chol/HDL Ratio 5.8 (H) 0.0 - 4.4 ratio  TSH  Result Value Ref Range   TSH 3.210 0.450 - 4.500 uIU/mL  Urinalysis, Routine w reflex microscopic  Result Value Ref Range   Specific Gravity, UA 1.025 1.005 - 1.030   pH, UA 6.5 5.0 - 7.5   Color, UA Yellow Yellow   Appearance Ur Cloudy (A) Clear   Leukocytes,UA 1+ (A) Negative   Protein,UA Negative Negative/Trace   Glucose, UA Negative Negative   Ketones, UA Negative Negative   RBC, UA Negative Negative   Bilirubin, UA Negative Negative   Urobilinogen, Ur 0.2 0.2 - 1.0 mg/dL   Nitrite, UA Negative Negative   Microscopic Examination See below:   T4, free  Result Value Ref Range   Free T4 1.28 0.82 - 1.77 ng/dL      Assessment & Plan:   Problem List Items Addressed This Visit   None    Follow up plan: No follow-ups on file.

## 2023-06-30 ENCOUNTER — Telehealth: Payer: Self-pay | Admitting: Nurse Practitioner

## 2023-06-30 ENCOUNTER — Other Ambulatory Visit: Payer: Self-pay | Admitting: Nurse Practitioner

## 2023-06-30 DIAGNOSIS — E039 Hypothyroidism, unspecified: Secondary | ICD-10-CM

## 2023-06-30 NOTE — Telephone Encounter (Signed)
Medication Refill -  Most Recent Primary Care Visit:  Provider: Providence Crosby  Department: [facility]  Visit Type: OFFICE VISIT  Date: 03/04/2023  Medication:  levothyroxine (SYNTHROID) 100 MCG tablet   Has the patient contacted their pharmacy? No  Is this the correct pharmacy for this prescription? Yes If no, delete pharmacy and type the correct one.  This is the patient's preferred pharmacy:  [facility] [address] [place]), [place] - [address] Phone: [phone]  Fax: [phone]      Has the prescription been filled recently? Yes  Is the patient out of the medication? Yes  Has the patient been seen for an appointment in the last year OR does the patient have an upcoming appointment? Yes  Can we respond through MyChart? Yes  Agent: Please be advised that Rx refills may take up to 3 business days. We ask that you follow-up with your pharmacy.

## 2023-06-30 NOTE — Telephone Encounter (Signed)
Already addressed in a previous encounter today.

## 2023-06-30 NOTE — Telephone Encounter (Signed)
Patient called stated she now needs all her meds to go to the  [facility] [address]), [place] - [address] Phone: [phone]  Fax: [phone]    As she has moved to [place] now.

## 2023-07-01 MED ORDER — LEVOTHYROXINE SODIUM 100 MCG PO TABS: 100.0000 ug | ORAL_TABLET | Freq: Every day | ORAL | 1 refills | Status: AC

## 2023-07-01 NOTE — Telephone Encounter (Signed)
Requested Prescriptions  Pending Prescriptions Disp Refills   levothyroxine (SYNTHROID) 100 MCG tablet 90 tablet 1    Sig: Take 1 tablet (100 mcg total) by mouth daily.     Endocrinology:  Hypothyroid Agents Passed - 06/30/2023 10:35 AM      Passed - TSH in normal range and within 360 days    TSH  Date Value Ref Range Status  12/23/2022 3.210 0.450 - 4.500 uIU/mL Final         Passed - Valid encounter within last 12 months    Recent Outpatient Visits           3 months ago Attention deficit hyperactivity disorder (ADHD), unspecified ADHD type   [place] [facility] Mecum, Erin E, PA-C   5 months ago Acute bilateral thoracic back pain   [place] [facility] Mecum, Oswaldo Conroy, PA-C   6 months ago Annual physical exam   [place] [facility] Larae Grooms, NP   9 months ago Congestion of nasal sinus   [place] [facility] Larae Grooms, NP   1 year ago Attention deficit hyperactivity disorder (ADHD), unspecified ADHD type   [place] [facility] Larae Grooms, NP

## 2023-07-03 IMAGING — DX DG KNEE 1-2V PORT*R*
2 series · 2 of 2 positions shown · non-contrast
Comparison: None Available.

CLINICAL DATA: Postop right knee replacement

EXAM:
PORTABLE RIGHT KNEE - 1-2 VIEW

[knee ap]
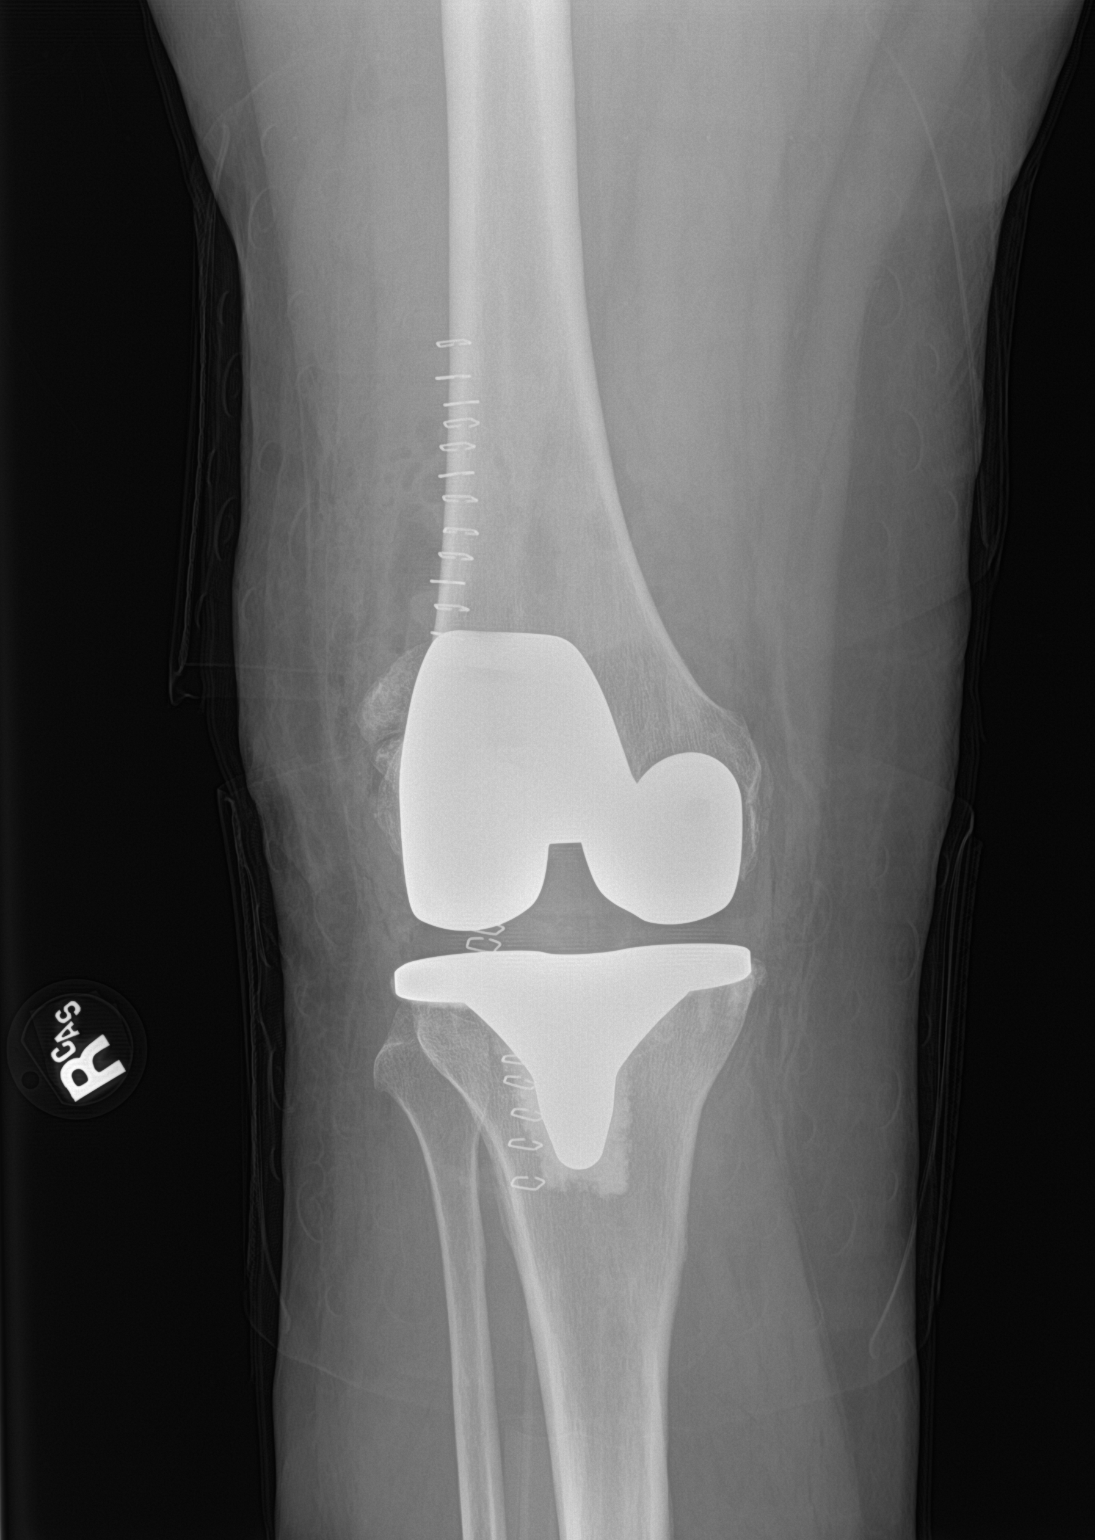

[knee lat]
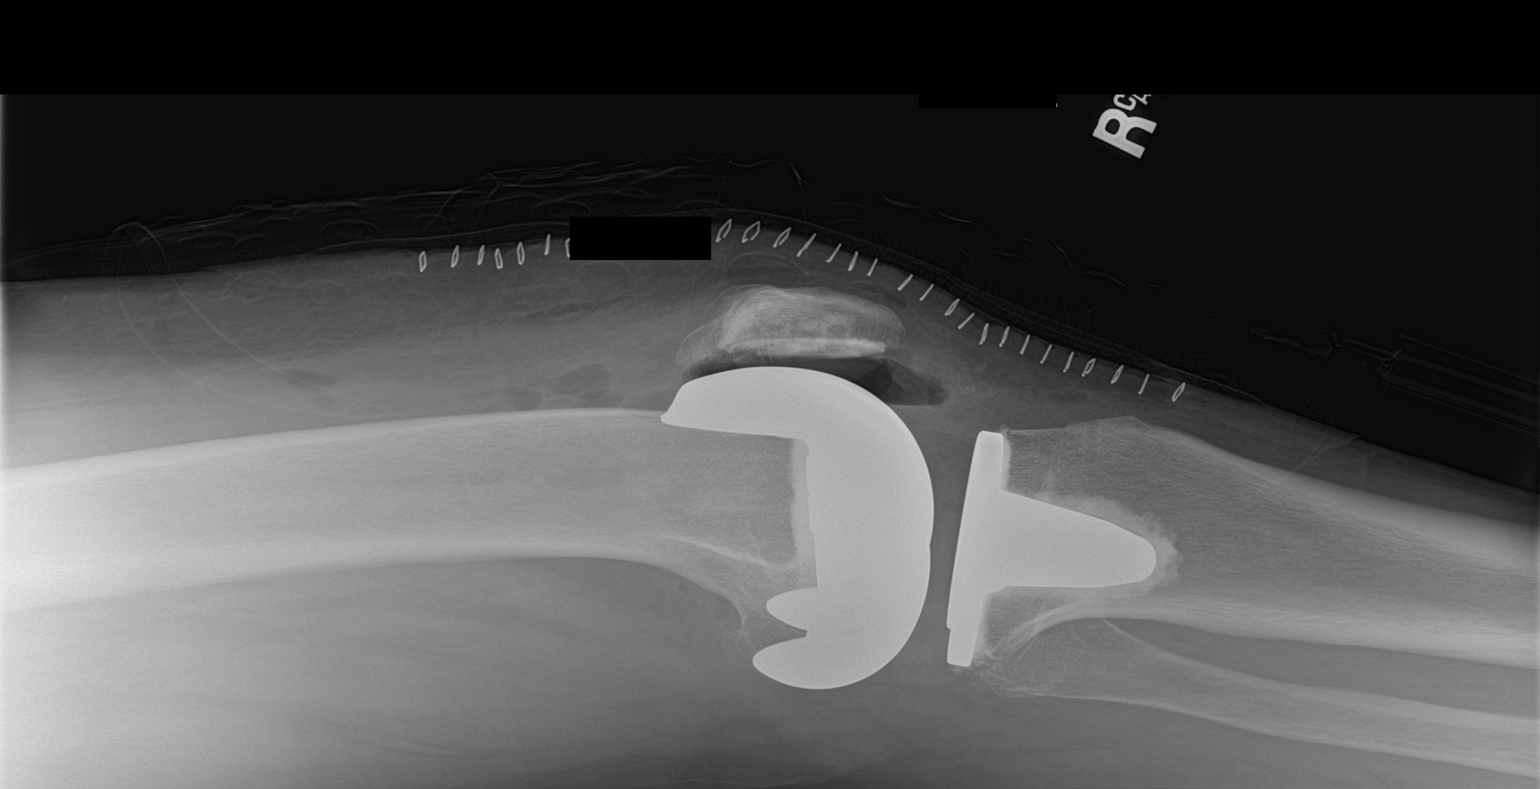

[2 of 2 positions shown; findings below may reference images not displayed]

FINDINGS: Satisfactory right knee replacement. No fracture or complication.
Fluid and gas in the joint.
IMPRESSION: Satisfactory right knee replacement

## 2023-07-21 ENCOUNTER — Other Ambulatory Visit: Payer: Self-pay | Admitting: Nurse Practitioner

## 2023-07-21 DIAGNOSIS — F909 Attention-deficit hyperactivity disorder, unspecified type: Secondary | ICD-10-CM

## 2023-07-21 NOTE — Telephone Encounter (Signed)
Medication Refill -  Most Recent Primary Care Visit:  Provider: Jacquelin Hawking E  Department: [facility]  Visit Type: OFFICE VISIT  Date: 03/04/2023  Medication: amphetamine-dextroamphetamine (ADDERALL XR) 30 MG 24 hr capsule Pt requesting refill. Inquired about any future appts scheduled. Advised pt no other appts are scheduled for her and there is a missed appointment from 06/07/2023. Pt verbalized understanding and hung up.   Has the patient contacted their pharmacy? Yes (Agent: If yes, when and what did the pharmacy advise?)  Is this the correct pharmacy for this prescription? Yes This is the patient's preferred pharmacy:  [facility] [address], [place] - [address] [place] [address] Phone: [phone] Fax: [phone]   Has the prescription been filled recently? No  Is the patient out of the medication? Pt hung up before answering  Has the patient been seen for an appointment in the last year OR does the patient have an upcoming appointment? Yes  Can we respond through MyChart? Yes  Agent: Please be advised that Rx refills may take up to 3 business days. We ask that you follow-up with your pharmacy.

## 2023-07-22 NOTE — Telephone Encounter (Signed)
Requested medications are due for refill today.  yes  Requested medications are on the active medications list.  yes  Last refill. 05/05/2023 #30 0 rf  Future visit scheduled.   no  Notes to clinic.  Refill not delegated.    Requested Prescriptions  Pending Prescriptions Disp Refills   amphetamine-dextroamphetamine (ADDERALL XR) 30 MG 24 hr capsule 30 capsule 0    Sig: Take 1 capsule (30 mg total) by mouth daily.     Not Delegated - Psychiatry:  Stimulants/ADHD Failed - 07/22/2023  7:59 AM      Failed - This refill cannot be delegated      Failed - Urine Drug Screen completed in last 360 days      Failed - Last BP in normal range    BP Readings from Last 1 Encounters:  05/29/23 (!) 160/88         Passed - Last Heart Rate in normal range    Pulse Readings from Last 1 Encounters:  05/29/23 64         Passed - Valid encounter within last 6 months    Recent Outpatient Visits           4 months ago Attention deficit hyperactivity disorder (ADHD), unspecified ADHD type   [place] [facility] Mecum, Erin E, PA-C   6 months ago Acute bilateral thoracic back pain   [place] [facility] Mecum, Oswaldo Conroy, PA-C   7 months ago Annual physical exam   [place] [facility] Larae Grooms, NP   10 months ago Congestion of nasal sinus   [place] [facility] Larae Grooms, NP   1 year ago Attention deficit hyperactivity disorder (ADHD), unspecified ADHD type   [place] [facility] Larae Grooms, NP
